# Patient Record
Sex: Female | Born: 1945 | Race: White | Hispanic: No | Marital: Married | Smoking: Never smoker
Health system: Southern US, Academic
[De-identification: ages and names within clinical notes are randomized; demographics above are authoritative.]

## PROBLEM LIST (undated history)

## (undated) DIAGNOSIS — H04123 Dry eye syndrome of bilateral lacrimal glands: Secondary | ICD-10-CM

## (undated) DIAGNOSIS — J8410 Pulmonary fibrosis, unspecified: Secondary | ICD-10-CM

## (undated) DIAGNOSIS — G4700 Insomnia, unspecified: Secondary | ICD-10-CM

## (undated) DIAGNOSIS — M3500 Sicca syndrome, unspecified: Secondary | ICD-10-CM

## (undated) DIAGNOSIS — H353290 Exudative age-related macular degeneration, unspecified eye, stage unspecified: Secondary | ICD-10-CM

## (undated) DIAGNOSIS — C801 Malignant (primary) neoplasm, unspecified: Secondary | ICD-10-CM

## (undated) DIAGNOSIS — H25019 Cortical age-related cataract, unspecified eye: Secondary | ICD-10-CM

## (undated) DIAGNOSIS — L4050 Arthropathic psoriasis, unspecified: Secondary | ICD-10-CM

## (undated) DIAGNOSIS — G2581 Restless legs syndrome: Secondary | ICD-10-CM

## (undated) DIAGNOSIS — H35033 Hypertensive retinopathy, bilateral: Secondary | ICD-10-CM

## (undated) DIAGNOSIS — Z860100 Personal history of colon polyps, unspecified: Secondary | ICD-10-CM

## (undated) DIAGNOSIS — K219 Gastro-esophageal reflux disease without esophagitis: Secondary | ICD-10-CM

## (undated) DIAGNOSIS — H3530 Unspecified macular degeneration: Secondary | ICD-10-CM

## (undated) HISTORY — PX: BUNIONECTOMY: SHX129

## (undated) HISTORY — PX: EYE SURGERY: SHX253

## (undated) HISTORY — DX: Insomnia, unspecified: G47.00

## (undated) HISTORY — DX: Sjogren syndrome, unspecified: M35.00

## (undated) HISTORY — PX: TUBAL LIGATION: SHX77

## (undated) HISTORY — DX: Arthropathic psoriasis, unspecified: L40.50

## (undated) HISTORY — DX: Restless legs syndrome: G25.81

## (undated) HISTORY — DX: Exudative age-related macular degeneration, unspecified eye, stage unspecified: H35.3290

## (undated) HISTORY — DX: Pulmonary fibrosis, unspecified: J84.10

## (undated) HISTORY — PX: COLONOSCOPY: SHX174

---

## 2010-06-20 ENCOUNTER — Ambulatory Visit
Admission: RE | Admit: 2010-06-20 | Disposition: A | Discharge status: Home / Self Care | Payer: Self-pay | Source: Ambulatory Visit

## 2010-07-17 ENCOUNTER — Ambulatory Visit
Admission: RE | Admit: 2010-07-17 | Disposition: A | Discharge status: Home / Self Care | Payer: Self-pay | Source: Ambulatory Visit

## 2011-02-12 ENCOUNTER — Ambulatory Visit
Admission: RE | Admit: 2011-02-12 | Disposition: A | Discharge status: Home / Self Care | Payer: Self-pay | Source: Ambulatory Visit

## 2011-08-06 ENCOUNTER — Ambulatory Visit
Admission: RE | Admit: 2011-08-06 | Disposition: A | Discharge status: Home / Self Care | Payer: Self-pay | Source: Ambulatory Visit | Admitting: Gastroenterology

## 2012-01-08 ENCOUNTER — Ambulatory Visit
Admission: RE | Admit: 2012-01-08 | Disposition: A | Discharge status: Home / Self Care | Payer: Self-pay | Source: Ambulatory Visit

## 2012-07-09 LAB — VH H. PYLORI ANTIBODY IGG HLAB: H. pylori Ab IgG: NEGATIVE

## 2013-02-23 ENCOUNTER — Other Ambulatory Visit: Payer: Self-pay | Admitting: Physician Assistant

## 2013-02-23 DIAGNOSIS — Z1231 Encounter for screening mammogram for malignant neoplasm of breast: Secondary | ICD-10-CM

## 2013-02-25 ENCOUNTER — Ambulatory Visit: Payer: Medicare Other

## 2013-02-26 ENCOUNTER — Ambulatory Visit
Admission: RE | Admit: 2013-02-26 | Discharge: 2013-02-26 | Disposition: A | Discharge status: Home / Self Care | Payer: Medicare Other | Source: Ambulatory Visit | Attending: Physician Assistant | Admitting: Physician Assistant

## 2013-02-26 DIAGNOSIS — R920 Mammographic microcalcification found on diagnostic imaging of breast: Secondary | ICD-10-CM | POA: Insufficient documentation

## 2013-02-26 DIAGNOSIS — Z1231 Encounter for screening mammogram for malignant neoplasm of breast: Secondary | ICD-10-CM | POA: Insufficient documentation

## 2013-02-26 DIAGNOSIS — N6489 Other specified disorders of breast: Secondary | ICD-10-CM | POA: Insufficient documentation

## 2013-04-13 ENCOUNTER — Other Ambulatory Visit: Payer: Self-pay | Admitting: Internal Medicine

## 2013-04-13 ENCOUNTER — Ambulatory Visit
Admission: RE | Admit: 2013-04-13 | Discharge: 2013-04-13 | Disposition: A | Discharge status: Home / Self Care | Payer: Medicare Other | Source: Ambulatory Visit | Attending: Internal Medicine | Admitting: Internal Medicine

## 2013-04-13 DIAGNOSIS — M069 Rheumatoid arthritis, unspecified: Secondary | ICD-10-CM | POA: Insufficient documentation

## 2013-04-13 DIAGNOSIS — R0609 Other forms of dyspnea: Secondary | ICD-10-CM | POA: Insufficient documentation

## 2013-04-13 DIAGNOSIS — R0989 Other specified symptoms and signs involving the circulatory and respiratory systems: Secondary | ICD-10-CM

## 2013-04-13 DIAGNOSIS — J189 Pneumonia, unspecified organism: Secondary | ICD-10-CM | POA: Insufficient documentation

## 2013-04-20 ENCOUNTER — Other Ambulatory Visit: Payer: Self-pay | Admitting: Internal Medicine

## 2013-04-20 DIAGNOSIS — R918 Other nonspecific abnormal finding of lung field: Secondary | ICD-10-CM

## 2013-04-23 ENCOUNTER — Ambulatory Visit
Admission: RE | Admit: 2013-04-23 | Discharge: 2013-04-23 | Disposition: A | Discharge status: Home / Self Care | Payer: Medicare Other | Source: Ambulatory Visit | Attending: Internal Medicine | Admitting: Internal Medicine

## 2013-04-23 DIAGNOSIS — J8410 Pulmonary fibrosis, unspecified: Secondary | ICD-10-CM | POA: Insufficient documentation

## 2013-04-23 DIAGNOSIS — K7689 Other specified diseases of liver: Secondary | ICD-10-CM | POA: Insufficient documentation

## 2013-04-23 DIAGNOSIS — R918 Other nonspecific abnormal finding of lung field: Secondary | ICD-10-CM

## 2013-07-09 ENCOUNTER — Ambulatory Visit (INDEPENDENT_AMBULATORY_CARE_PROVIDER_SITE_OTHER): Payer: Medicare Other | Admitting: Pulmonary Disease

## 2013-07-09 ENCOUNTER — Encounter (INDEPENDENT_AMBULATORY_CARE_PROVIDER_SITE_OTHER): Payer: Self-pay | Admitting: Pulmonary Disease

## 2013-07-09 ENCOUNTER — Ambulatory Visit (INDEPENDENT_AMBULATORY_CARE_PROVIDER_SITE_OTHER): Payer: Self-pay | Admitting: Pulmonary Disease

## 2013-07-09 ENCOUNTER — Other Ambulatory Visit: Payer: Self-pay | Admitting: Pulmonary Disease

## 2013-07-09 VITALS — BP 145/77 | HR 66 | Temp 99.1°F | Ht 64.0 in | Wt 153.0 lb

## 2013-07-09 DIAGNOSIS — R0602 Shortness of breath: Secondary | ICD-10-CM

## 2013-07-09 DIAGNOSIS — J8410 Pulmonary fibrosis, unspecified: Secondary | ICD-10-CM

## 2013-07-09 DIAGNOSIS — M3500 Sicca syndrome, unspecified: Secondary | ICD-10-CM

## 2013-07-09 DIAGNOSIS — L4050 Arthropathic psoriasis, unspecified: Secondary | ICD-10-CM | POA: Insufficient documentation

## 2013-07-09 DIAGNOSIS — E7800 Pure hypercholesterolemia, unspecified: Secondary | ICD-10-CM | POA: Insufficient documentation

## 2013-07-09 DIAGNOSIS — J849 Interstitial pulmonary disease, unspecified: Secondary | ICD-10-CM

## 2013-07-09 NOTE — Progress Notes (Signed)
Subjective:     Patient ID:  Katelyn Gates is an 68 y.o. female     Chief Complaint:    Chief Complaint   Patient presents with    New Patient    Follow-up After Testing     ct     Sleep Apnea       HPI  Mrs. Katelyn Gates is a 68 year old female with history of Seronegative Rheumatoid arthiritis, Sjogren's syndrome here for her first visit for evaluation of shortness of breath. Initially when she was diagnosed with RA she was started on Methotrexate with significant improvement in her symptoms. She recently had a CT chest for dyspnea, which suggested upper lobe ILF, mostly peripherally. He dyspnea started about three months ago and is mostly exertional.  She has no cough, wheezing, chest pain, palpitation, orthopnea, PND or LE swelling. After the CT chest result methotrexate was stopped and she was started on Imuran. She is a never smoker and denies any occupation or environmental exposure history.   Past Medical History  Current Outpatient Prescriptions   Medication Sig    azaTHIOprine (IMURAN) 50 mg Oral Tablet Take 50 mg by mouth Once a day    butalbital-aspirin-caffeine (FIORINAL) 50-325-40 mg Oral Capsule Take 1 Cap by mouth Every 4 hours as needed for Pain    Fish Oil-Omega-3 Fatty Acids 300-1,000 mg Oral Capsule Take by mouth    Pilocarpine HCl (SALAGEN) 5 mg Oral Tablet Take by mouth    simvastatin (ZOCOR) 10 mg Oral Tablet Take 10 mg by mouth Every evening    VIT A/VIT C/VIT E/ZINC/COPPER (PRESERVISION AREDS ORAL) Take by mouth     Allergies   Allergen Reactions    Penicillins Hives/ Urticaria     No past medical history on file.  No past surgical history on file.  Family History   Problem Relation Age of Onset    Congestive Heart Failure Mother     Stroke Mother     Hypertension Mother     Diabetes Mother     Arthritis-osteo Sister     Diabetes Brother      History     Social History    Marital Status: Married     Spouse Name: N/A     Number of Children: N/A    Years of Education: N/A          Occupational History    retired Statisticianpre school teacher       Social History Main Topics    Smoking status: Never Smoker     Smokeless tobacco: Not on file    Alcohol Use: No    Drug Use: No    Sexual Activity: Not on file     Other Topics Concern    Not on file     Social History Narrative    No narrative on file       .  ROS  Constitutional: positive for fatigue  Respiratory: positive for dyspnea on exertion  Cardiovascular: negative  Gastrointestinal: negative  Hematologic/lymphatic: negative  Musculoskeletal:positive for myalgias and arthralgias  Neurological: negative  Behavioral/Psych: negative  All other ROS Negative    Objective:     Physical Exam    Pulmonary Vitals: BP 145/77   Pulse 66   Temp(Src) 37.3 C (99.1 F) (Oral)   Ht 1.626 m (5\' 4" )   Wt 69.4 kg (153 lb)   BMI 26.25 kg/m2   SpO2 98%  General: appears chronically ill  HENT:ENT without erythema or injection,  mucous membranes moist.  Neck: No JVD or thyromegaly or lymphadenopathy  Respiratory Observatoins Unlabored breathing  Tracheal Exam No tracheal deviation  Chest Wall Palp5ation No localized tenderness  Lungs: decreased breath sounds throughout all lung fields and rales superior posterior bilaterally  Cardiovascular:    Heart S1, S2 normal  Abdomen: soft, non-tender and bowel sounds normal  Extremities: no cyanosis or edema  Neurologic: CN II - XII grossly intact   .    Assessment & Plan:       ICD-9-CM    1. Psoriatic arthritis 696.0    2. Sjogren's disease 710.2    3. Hypercholesterolemia 272.0    4. Shortness of breath 786.05 FULL PULMONARY FUNCTION STUDY (INCLUDES FVC,SVC,DLCO,FRC,PRE/POST BD)     TRANSTHORACIC ECHOCARDIOGRAM - ADULT   5. ILD (interstitial lung disease) 515 CT CHEST HIGH RESOLUTION       68 year old female with RA and Sjogren's syndrome present with subacute shortness of breath and bilateral apical pulmonary fibrosis.  Radiographically, this does not seem to be due to methotrexate, but cannot rule it out. Since  methotrexate has ben stopped, I will do a High resolution CT and if no significant change in the pattern of the fibrosis,she will need a biopsy.   Plan:   PFT   CBC   HRCT   Echocardiogram (PAH screening)  RTO in one months

## 2013-07-16 ENCOUNTER — Other Ambulatory Visit: Payer: Self-pay | Admitting: Pulmonary Disease

## 2013-07-16 ENCOUNTER — Ambulatory Visit
Admission: RE | Admit: 2013-07-16 | Discharge: 2013-07-16 | Disposition: A | Discharge status: Home / Self Care | Payer: Medicare Other | Source: Ambulatory Visit | Attending: Pulmonary Disease | Admitting: Pulmonary Disease

## 2013-07-16 ENCOUNTER — Ambulatory Visit
Admission: RE | Admit: 2013-07-16 | Discharge: 2013-07-16 | Disposition: A | Discharge status: Home / Self Care | Payer: Medicare Other | Source: Ambulatory Visit

## 2013-07-16 DIAGNOSIS — J8410 Pulmonary fibrosis, unspecified: Secondary | ICD-10-CM | POA: Insufficient documentation

## 2013-07-16 DIAGNOSIS — I059 Rheumatic mitral valve disease, unspecified: Secondary | ICD-10-CM | POA: Insufficient documentation

## 2013-07-16 DIAGNOSIS — K7689 Other specified diseases of liver: Secondary | ICD-10-CM | POA: Insufficient documentation

## 2013-07-16 DIAGNOSIS — I079 Rheumatic tricuspid valve disease, unspecified: Secondary | ICD-10-CM | POA: Insufficient documentation

## 2013-07-16 DIAGNOSIS — R0602 Shortness of breath: Secondary | ICD-10-CM

## 2013-07-16 LAB — PULMONARY FUNCTION TEST
DL/VA- %Pred: 100 ml/min/mmHg/L
DL/VA- Actual: 4.8 ml/min/mmHg/L
DL/VA- Pred: 4.8 ml/min/mmHg/L
DLCOunc- %Pred: 51 ml/min/mmHg
DLCOunc- Actual: 12.49 ml/min/mmHg
DLCOunc- Pred: 24.26 ml/min/mmHg
ERV (Post-Bronch) %Chng: 4 L
ERV (Post-Bronch) %Pred: 67 L
ERV (Post-Bronch) Actual: 0.61 L
ERV (Pre-Bronch) %Pred: 65 L
ERV (Pre-Bronch) Actual: 0.59 L
ERV (Pre-Bronch) Pred: 0.91 L
FEF 25% (Pre-Bronch) Pred: 4.8 L/sec
FEF 25-75% (Post-Bronch) %Chng: 21 L/sec
FEF 25-75% (Post-Bronch) %Pred: 105 L/sec
FEF 25-75% (Post-Bronch) Actual: 2.11 L/sec
FEF 25-75% (Pre-Bronch) %Pred: 87 L/sec
FEF 25-75% (Pre-Bronch) Actual: 1.75 L/sec
FEF 25-75% (Pre-Bronch) Pred: 2.01 L/sec
FEF 75% (Pre-Bronch) Pred: 1.04 L/sec
FEF Max (Post-Bronch) %Chng: 17 L/sec
FEF Max (Post-Bronch) %Pred: 101 L/sec
FEF Max (Post-Bronch) Actual: 5.89 L/sec
FEF Max (Pre-Bronch) %Pred: 87 L/sec
FEF Max (Pre-Bronch) Actual: 5.05 L/sec
FEF Max (Pre-Bronch) Pred: 5.83 L/sec
FEF25 (% Predicted-Pre): 105 L/sec
FEF25 (%Change-Post): 14 L/sec
FEF25 (%Predicted-Post): 120 L/sec
FEF25 (Post): 5.76 L/sec
FEF25 (Pre-Actual): 5.03 L/sec
FEF75 (% Predicted-Pre): 56 L/sec
FEF75 (%Change-Post): 37 L/sec
FEF75 (%Predicted-Post): 77 L/sec
FEF75 (Post): 0.8 L/sec
FEF75 (Pre-Actual): 0.58 L/sec
FEV1 (Post-Bronch) %Chng: 5 L
FEV1 (Post-Bronch) %Pred: 84 L
FEV1 (Post-Bronch) Post: 1.96 L
FEV1 (Pre-Bronch) %Pred: 80 L
FEV1 (Pre-Bronch) Actual: 1.86 L
FEV1 (Pre-Bronch) Pred: 2.34 L
FEV1/FVC (Post-Bronch) %Chng: 4 %
FEV1/FVC (Post-Bronch) %Pred: 108 %
FEV1/FVC (Post-Bronch) Actual: 82 %
FEV1/FVC (Pre-Bronch) %Pred: 104 %
FEV1/FVC (Pre-Bronch) Actual: 79 %
FEV1/FVC (Pre-Bronch) Pred: 76 %
FEV1FEV6 (% Predicted-Pre): 100 %
FEV1FEV6 (%Change-Post): 3 %
FEV1FEV6 (%Predicted-Post): 103 %
FEV1FEV6 (Post): 82 %
FEV1FEV6 (Pre-Actual): 80 %
FEV1FEV6 (Predicted): 80 %
FEV6 (% Predicted-Pre): 79 L
FEV6 (%Change-Post): 2 L
FEV6 (%Predicted-Post): 81 L
FEV6 (Post): 2.38 L
FEV6 (Pre-Actual): 2.34 L
FEV6 (Predicted): 2.95 L
FEV6FVC (% Predicted-Pre): 104 %
FEV6FVC (%Change-Post): 0 %
FEV6FVC (Post): 100 %
FEV6FVC (Pre-Actual): 99 %
FEV6FVC (Predicted): 96 %
FIF50 (Predicted): 3.28 L/sec
FIFMax (%Change-Post): -13 L/sec
FIFMax (Post): 3.38 L/sec
FIFMax (Pre-Actual): 3.9 L/sec
FIVC (%Change-Post): 0 L
FIVC (Post): 2.05 L
FIVC (Pre-Actual): 2.05 L
FVC (Post-Bronch) %Chng: 1 L
FVC (Post-Bronch) %Pred: 77 L
FVC (Post-Bronch) Actual: 2.38 L
FVC (Pre-Bronch) %Pred: 76 L
FVC (Pre-Bronch) Actual: 2.35 L
FVC (Pre-Bronch) Pred: 3.08 L
Gaw (Post-Bronch) %Chng: 26 L/s/cmH2O
Gaw (Post-Bronch) %Pred: 87 L/s/cmH2O
Gaw (Post-Bronch) Actual: 0.9 L/s/cmH2O
Gaw (Pre-Bronch) %Pred: 69 L/s/cmH2O
Gaw (Pre-Bronch) Actual: 0.71 L/s/cmH2O
Gaw (Pre-Bronch) Pred: 1.03 L/s/cmH2O
IC (Post-Bronch) %Chng: -24 L
IC (Post-Bronch) %Pred: 58 L
IC (Post-Bronch) Actual: 1.25 L
IC (Pre-Bronch) %Pred: 76 L
IC (Pre-Bronch) Actual: 1.64 L
IC (Pre-Bronch) Pred: 2.17 L
IVC (Pre-Actual): 2.14 L
RV (Pleth) (Post-Bronch) %Pred: 118 L
RV (Pleth) (Post-Bronch) Actual: 2.53 L
RV (Pleth) (Pre-Bronch) %Pred: 119 L
RV (Pleth) (Pre-Bronch) Actual: 2.54 L
RV (Pleth) (Pre-Bronch) Pred: 2.14 L
RV/TLC (Pleth) (Post-Bronch) %Pred: 137 %
RV/TLC (Pleth) (Post-Bronch) Actual: 58 %
RV/TLC (Pleth) (Pre-Bronch) Actual: 53 %
RV/TLC (Pleth) (Pre-Bronch) Pred: 42 %
Raw (Post-Bronch) %Chng: -20 cmH2O/L/s
Raw (Post-Bronch) %Pred: 60 cmH2O/L/s
Raw (Post-Bronch) Actual: 1.12 cmH2O/L/s
Raw (Pre-Bronch) %Pred: 75 cmH2O/L/s
Raw (Pre-Bronch) Actual: 1.4 cmH2O/L/s
Raw (Pre-Bronch) Pred: 1.86 cmH2O/L/s
SVC (Post-Bronch) %Chng: -17 L
SVC (Post-Bronch) %Pred: 60 L
SVC (Post-Bronch) Actual: 1.86 L
SVC (Pre-Bronch) %Pred: 72 L
SVC (Pre-Bronch) Actual: 2.23 L
SVC (Pre-Bronch) Pred: 3.08 L
TGV (Post-Bronch) %Pred: 109 L
TGV (Post-Bronch) Actual: 3.14 L
TGV (Pre-Bronch) %Pred: 109 L
TGV (Pre-Bronch) Actual: 3.13 L
TGV (Pre-Bronch) Pred: 2.88 L
TLC (Pleth) (Post-Bronch) %Chng: -8 L
TLC (Pleth) (Post-Bronch) %Pred: 87 L
TLC (Pleth) (Post-Bronch) Actual: 4.39 L
TLC (Pleth) (Pre-Bronch) %Pred: 94 L
TLC (Pleth) (Pre-Bronch) Actual: 4.77 L
TLC (Pleth) (Pre-Bronch) Pred: 5.05 L
VA (% Predicted-Pre): 64 L
VA (Pre-Actual): 3.26 L
VA- Pred: 5.05 L
sGaw (Post-Bronch) %Pred: 139 1/cmH2O*s
sGaw (Post-Bronch) Actual: 0.28 1/cmH2O*s
sGaw (Pre-Bronch) %Pred: 109 1/cmH2O*s
sGaw (Pre-Bronch) Actual: 0.22 1/cmH2O*s
sGaw (Pre-Bronch) Pred: 0.2 1/cmH2O*s
sRaw (Post-Bronch) %Pred: 76 cmH2O*s
sRaw (Post-Bronch) Actual: 3.62 cmH2O*s
sRaw (Pre-Bronch) %Pred: 97 cmH2O*s
sRaw (Pre-Bronch) Actual: 4.6 cmH2O*s
sRaw (Pre-Bronch) Pred: 4.76 cmH2O*s

## 2013-07-16 MED ORDER — ALBUTEROL SULFATE (2.5 MG/3ML) 0.083% IN NEBU
2.5000 mg | INHALATION_SOLUTION | Freq: Once | RESPIRATORY_TRACT | Status: AC
Start: 2013-07-16 — End: 2013-07-16
  Administered 2013-07-16: 2.5 mg via RESPIRATORY_TRACT

## 2013-07-27 NOTE — Procedures (Signed)
Date: 07/16/2013  REQUESTING PHYSICIAN: Adron Bene, MD    HEIGHT:  64.00    WEIGHT:  150.00    TECHNICIAN: MT    RACE: Caucasian.    SMOKING HX: Never smoked.    DIAGNOSIS: Shortness of breath, chronic obstructive pulmonary  disease.    Forced vital capacity of 2.35 L, 76% of predicted.  The FEV1 is  1.86 L, 79% of predicted.  The FEV1% is 79.  The FEF 25-75 is  1.75 L/second which is 87% of predicted.    After the application of inhaled bronchodilators, there is  statistically significant improvement in the FEF 25-75 alone.    Lung volumes reveal total lung capacity 94% of predicted.  Residual volume 118% of predicted.  Diffusion capacity 51% of  predicted.  Airway resistance is normal.  Flow volume loops  demonstrate a decreased volume.    ASSESSMENT: The patient's spirometry is essentially within  normal limits.  There is improvement in the FEF 25-75 with  bronchodilators.  The lung volumes do demonstrate mild  hyperinflation.  The patient's diffusion capacity is reduced.  The reduction appears to be out of proportion to the above noted  spirometric changes.  No previous pulmonary function studies are  available for comparison at this time.        [id number]  DD: 07/27/2013 07:37:50  DT: 07/27/2013 09:51:04  JOB: [id number]

## 2013-08-13 ENCOUNTER — Ambulatory Visit (INDEPENDENT_AMBULATORY_CARE_PROVIDER_SITE_OTHER): Payer: Medicare Other | Admitting: Pulmonary Disease

## 2013-08-13 ENCOUNTER — Encounter (INDEPENDENT_AMBULATORY_CARE_PROVIDER_SITE_OTHER): Payer: Self-pay | Admitting: Pulmonary Disease

## 2013-08-13 VITALS — BP 138/78 | HR 65 | Temp 97.9°F | Ht 64.0 in | Wt 149.0 lb

## 2013-08-13 DIAGNOSIS — M3500 Sicca syndrome, unspecified: Secondary | ICD-10-CM

## 2013-08-13 DIAGNOSIS — J8410 Pulmonary fibrosis, unspecified: Secondary | ICD-10-CM

## 2013-08-13 DIAGNOSIS — R0602 Shortness of breath: Secondary | ICD-10-CM

## 2013-08-13 DIAGNOSIS — E7800 Pure hypercholesterolemia, unspecified: Secondary | ICD-10-CM

## 2013-08-13 DIAGNOSIS — J849 Interstitial pulmonary disease, unspecified: Secondary | ICD-10-CM

## 2013-08-13 DIAGNOSIS — L4050 Arthropathic psoriasis, unspecified: Secondary | ICD-10-CM

## 2013-08-13 NOTE — Progress Notes (Signed)
Subjective:     Patient ID:  Katelyn Gates is an 68 y.o. female     Chief Complaint:    Chief Complaint   Patient presents with    Follow-up After Testing     pft     Sleep Apnea    Arthritis     psoriatic       HPI  [name] is here for follow up visit. She has some dyspnea on exertion, which has not change for the past two years.  Her Ct scan showed mild fibrotic changes in mid to upper lung zones, in the periphery. Her PFT was normal, but i do not have her full PFT. Her echo was normal as well. She has stopped her methotrexate     Past Medical History  Current Outpatient Prescriptions   Medication Sig    apremilast (OTEZLA) 30 mg Oral Tablet Take 30 mg by mouth Twice daily    butalbital-aspirin-caffeine (FIORINAL) 50-325-40 mg Oral Capsule Take 1 Cap by mouth Every 4 hours as needed for Pain    Fish Oil-Omega-3 Fatty Acids 300-1,000 mg Oral Capsule Take by mouth    Pilocarpine HCl (SALAGEN) 5 mg Oral Tablet Take by mouth    VIT A/VIT C/VIT E/ZINC/COPPER (PRESERVISION AREDS ORAL) Take by mouth     Allergies   Allergen Reactions    Penicillins Hives/ Urticaria     No past medical history on file.  No past surgical history on file.  Family History   Problem Relation Age of Onset    Congestive Heart Failure Mother     Stroke Mother     Hypertension Mother     Diabetes Mother     Arthritis-osteo Sister     Diabetes Brother      History     Social History    Marital Status: Married     Spouse Name: N/A     Number of Children: N/A    Years of Education: N/A     Occupational History    retired Statisticianpre school teacher       Social History Main Topics    Smoking status: Never Smoker     Smokeless tobacco: Not on file    Alcohol Use: No    Drug Use: No    Sexual Activity: Not on file     Other Topics Concern    Not on file     Social History Narrative    No narrative on file       ROS  Constitutional: positive for fatigue  Respiratory: negative  Cardiovascular: negative  Gastrointestinal:  negative  Musculoskeletal:positive for arthralgias  Neurological: negative  Behavioral/Psych: negative  All other ROS Negative  Objective:     Physical Exam    Pulmonary Vitals: BP 138/78   Pulse 65   Temp(Src) 36.6 C (97.9 F) (Tympanic)   Ht 1.626 m (5\' 4" )   Wt 67.586 kg (149 lb)   BMI 25.56 kg/m2   SpO2 97%  Neck: No JVD or thyromegaly or lymphadenopathy  Respiratory Observatoins Unlabored breathing  Tracheal Exam No tracheal deviation  Chest Wall Palpation No localized tenderness  Lungs: clear to auscultation and percussion bilaterally.   Cardiovascular:    Heart regular rate and rhythm, S1, S2 normal, no murmur, click, rub or gallop  Abdomen: soft, non-tender and bowel sounds normal  Extremities: extremities normal, atraumatic, no cyanosis or edema  Neurologic: CN II - XII grossly intact   .  CT chest:  Fibrotic changes  in mid to upper lung zones, mostly in the periphery  PFT: moderate obstruction and restriction  With decrease in diffusion capacity   Echo normal   Assessment & Plan:       ICD-9-CM    1. Sjogren's disease 710.2    2. Hypercholesterolemia 272.0    3. Shortness of breath 786.05    4. Psoriatic arthritis 696.0    5. ILD (interstitial lung disease) 515 CT CHEST HIGH RESOLUTION         68 year old with DOE and mild mid to upper lung zone fibrosis. The etiology includes methotrexate and COP. I dicussed in detail different option regarding the diagnosis. The options are: open lung biopsy vs radiographic follow up vs six month steroids. At this time she would like to just monitor radiographically.  I would avoid any drug that may have pulmonary toxicity as side effect, such as methotrexate.   RTO in six months

## 2013-08-20 ENCOUNTER — Encounter (INDEPENDENT_AMBULATORY_CARE_PROVIDER_SITE_OTHER): Payer: Medicare Other | Admitting: Pulmonary Disease

## 2013-08-31 ENCOUNTER — Encounter (INDEPENDENT_AMBULATORY_CARE_PROVIDER_SITE_OTHER): Payer: Medicare Other | Admitting: Pulmonary Disease

## 2013-10-08 ENCOUNTER — Ambulatory Visit
Admission: RE | Admit: 2013-10-08 | Discharge: 2013-10-08 | Disposition: A | Discharge status: Home / Self Care | Payer: Medicare Other | Source: Ambulatory Visit | Attending: Interventional Cardiology | Admitting: Interventional Cardiology

## 2013-10-08 ENCOUNTER — Ambulatory Visit: Payer: Medicare Other | Admitting: Interventional Cardiology

## 2013-10-08 ENCOUNTER — Encounter
Admission: RE | Disposition: A | Discharge status: Home / Self Care | Payer: Self-pay | Source: Ambulatory Visit | Attending: Interventional Cardiology

## 2013-10-08 DIAGNOSIS — R942 Abnormal results of pulmonary function studies: Secondary | ICD-10-CM | POA: Insufficient documentation

## 2013-10-08 DIAGNOSIS — J8410 Pulmonary fibrosis, unspecified: Secondary | ICD-10-CM | POA: Insufficient documentation

## 2013-10-08 DIAGNOSIS — Z880 Allergy status to penicillin: Secondary | ICD-10-CM | POA: Insufficient documentation

## 2013-10-08 DIAGNOSIS — I2789 Other specified pulmonary heart diseases: Secondary | ICD-10-CM

## 2013-10-08 DIAGNOSIS — G4733 Obstructive sleep apnea (adult) (pediatric): Secondary | ICD-10-CM | POA: Insufficient documentation

## 2013-10-08 DIAGNOSIS — E785 Hyperlipidemia, unspecified: Secondary | ICD-10-CM | POA: Insufficient documentation

## 2013-10-08 DIAGNOSIS — R0989 Other specified symptoms and signs involving the circulatory and respiratory systems: Secondary | ICD-10-CM | POA: Insufficient documentation

## 2013-10-08 DIAGNOSIS — R002 Palpitations: Secondary | ICD-10-CM | POA: Insufficient documentation

## 2013-10-08 DIAGNOSIS — L4050 Arthropathic psoriasis, unspecified: Secondary | ICD-10-CM | POA: Insufficient documentation

## 2013-10-08 DIAGNOSIS — R0609 Other forms of dyspnea: Secondary | ICD-10-CM | POA: Insufficient documentation

## 2013-10-08 DIAGNOSIS — I272 Pulmonary hypertension, unspecified: Secondary | ICD-10-CM

## 2013-10-08 DIAGNOSIS — R0600 Dyspnea, unspecified: Secondary | ICD-10-CM | POA: Diagnosis present

## 2013-10-08 HISTORY — PX: LEFT HEART CATH: SHX5946

## 2013-10-08 HISTORY — DX: Pulmonary fibrosis, unspecified: J84.10

## 2013-10-08 HISTORY — DX: Arthropathic psoriasis, unspecified: L40.50

## 2013-10-08 HISTORY — DX: Sjogren syndrome, unspecified: M35.00

## 2013-10-08 HISTORY — DX: Gastro-esophageal reflux disease without esophagitis: K21.9

## 2013-10-08 HISTORY — DX: Unspecified macular degeneration: H35.30

## 2013-10-08 LAB — ECG 12-LEAD
P Wave Axis: 65 deg
P Wave Duration: 122 ms
P-R Interval: 186 ms
Patient Age: 67 years
Q-T Dispersion: 76 ms
Q-T Interval(Corrected): 406 ms
Q-T Interval: 394 ms
QRS Axis: 15 deg
QRS Duration: 72 ms
T Axis: 60 deg
Ventricular Rate: 64 /min

## 2013-10-08 LAB — CBC AND DIFFERENTIAL
Basophils %: 1.4 % (ref 0.0–3.0)
Basophils Absolute: 0.1 10*3/uL (ref 0.0–0.3)
Eosinophils %: 3.6 % (ref 0.0–7.0)
Eosinophils Absolute: 0.2 10*3/uL (ref 0.0–0.8)
Hematocrit: 40.9 % (ref 36.0–48.0)
Hemoglobin: 14.6 gm/dL (ref 12.0–16.0)
Lymphocytes Absolute: 1.8 10*3/uL (ref 0.6–5.1)
Lymphocytes: 37.6 % (ref 15.0–46.0)
MCH: 34 pg (ref 28–35)
MCHC: 36 gm/dL (ref 32–36)
MCV: 94 fL (ref 80–100)
MPV: 6.6 fL (ref 6.0–10.0)
Monocytes Absolute: 0.5 10*3/uL (ref 0.1–1.7)
Monocytes: 10.1 % (ref 3.0–15.0)
Neutrophils %: 47.3 % (ref 42.0–78.0)
Neutrophils Absolute: 2.2 10*3/uL (ref 1.7–8.6)
PLT CT: 262 10*3/uL (ref 130–440)
RBC: 4.35 10*6/uL (ref 3.80–5.00)
RDW: 11.2 % (ref 11.0–14.0)
WBC: 4.7 10*3/uL (ref 4.0–11.0)

## 2013-10-08 LAB — I-STAT CHEM 8 CARTRIDGE
Anion Gap I-Stat: 17 — ABNORMAL HIGH (ref 7–16)
BUN I-Stat: 10 mg/dL (ref 6–20)
Calcium Ionized I-Stat: 5.4 mg/dL — ABNORMAL HIGH (ref 4.35–5.10)
Chloride I-Stat: 103 mMol/L (ref 98–112)
Creatinine I-Stat: 0.7 mg/dL (ref 0.60–1.10)
EGFR: >60 mL/min/{1.73_m2}
Glucose I-Stat: 98 mg/dL (ref 70–99)
Hematocrit I-Stat: 41 % (ref 36.0–48.0)
Hemoglobin I-Stat: 13.9 gm/dL (ref 12.0–16.0)
Potassium I-Stat: 4 mMol/L (ref 3.5–5.3)
Sodium I-Stat: 143 mMol/L (ref 135–145)
TCO2 I-Stat: 27 mMol/L (ref 24–29)

## 2013-10-08 LAB — LIPID PANEL
Cholesterol: 263 mg/dL — ABNORMAL HIGH (ref 75–199)
Coronary Heart Disease Risk: 3.81
HDL: 69 mg/dL — ABNORMAL HIGH (ref 45–65)
LDL Calculated: 173 mg/dL
Triglycerides: 105 mg/dL (ref 10–150)
VLDL: 21 (ref 0–40)

## 2013-10-08 LAB — VH I-STAT CHEM 8 NOTIFICATION

## 2013-10-08 SURGERY — RIGHT HEART CATH
Laterality: Right

## 2013-10-08 MED ORDER — MIDAZOLAM HCL 2 MG/2ML IJ SOLN
INTRAMUSCULAR | Status: AC
Start: 2013-10-08 — End: 2013-10-08
  Administered 2013-10-08: 1 mg via INTRAVENOUS
  Filled 2013-10-08: qty 2

## 2013-10-08 MED ORDER — LIDOCAINE HCL 1 % IJ SOLN
INTRAMUSCULAR | Status: AC
Start: 2013-10-08 — End: 2013-10-08
  Administered 2013-10-08: 15 mL via SUBCUTANEOUS
  Filled 2013-10-08: qty 20

## 2013-10-08 MED ORDER — HEPARIN (PORCINE) IN NACL 2-0.9 UNIT/ML-% IJ SOLN
INTRAMUSCULAR | Status: AC
Start: 2013-10-08 — End: 2013-10-08
  Filled 2013-10-08: qty 500

## 2013-10-08 NOTE — Progress Notes (Signed)
Discharge instructions reviewed with pt, pt verbalized understanding, IV discontinued, cath intact, tele off, pt up to get dressed, pt discharge ambulatory to home with family.

## 2013-10-08 NOTE — H&P (Signed)
CARD: P.Romeo Apple, MD  PCP:   Erline HauKathie Rhodes. D'Allessio MD    HPI:  Patient referred for RHC by pulmonology.  Patient states she has SOB with exertion.  Some days she can walk a few miles and other days she has to slow down.  She denies any CP/pressure.  No edema, PND, orthopnea. She does have palpitations that come and go and can last for a week. She denies presyncope/syncope.  She does have some LH with certain medications but improved when off meds.      ECHO 07/2013  Normal LV size and function. EF 55-60%  Normal RV size and function. Structurally normal valves with trace MR, TR. Insufficient to estimate pulmonary pressure.     PMHx  1. Psoriatic arthritis  2. Schrogren's   3. Pulmonary fibrosis  4. OSA pending follow up sleep study  5. New HLP noted today on FLP    ROS:   Patient denies history of: CAD, CVD, PAD, TIA/CVA, blood clot, bleeding disorder, blood urine or stool, internal bleeding, kidney, liver, GERD, thyroid, cancer, seizure, MRSA, pacer or ICD, CHF    Social  Married  No tobacco  Rare ETOH  No illegal drug use    ALLERGY: PCN    MEDS:      Results for Courtney Callahan, Courtney Callahan (MRN [id number]) as of 10/08/2013 08:29   Ref. Range 10/08/2013 07:39   WBC Latest Range: 4.0-11.0 K/cmm 4.7   Hemoglobin Latest Range: 12.0-16.0 gm/dL 54.0   Hematocrit Latest Range: 36.0-48.0 % 40.9   Platelet Count Latest Range: 130-440 K/cmm 262   RBC Latest Range: 3.80-5.00 M/cmm 4.35   MCV Latest Range: 80-100 fL 94   MCH, POC Latest Range: 28-35 pg 34   MCHC Latest Range: 32-36 gm/dL 36   RDW Latest Range: 11.0-14.0 % 11.2   MPV Latest Range: 6.0-10.0 fL 6.6   Results for Courtney Callahan, Courtney Callahan (MRN [id number]) as of 10/08/2013 08:29   Ref. Range 10/08/2013 07:34 10/08/2013 07:39   Cholesterol Latest Range: 75-199 mg/dL  829 (H)   HDL Latest Range: 45-65 mg/dL  69 (H)   Calculated LDL No range found  173   Triglycerides Latest Range: 10-150 mg/dL  562   VLDL Latest Range: 0-40   21   i-STAT Glucose Latest Range: 70-99 mg/dL 98     i-STAT Sodium Latest Range: 135-145 mMol/L 143    i-STAT Potassium Latest Range: 3.5-5.3 mMol/L 4.0    i-STAT Chloride Latest Range: 98-112 mMol/L 103    i-STAT BUN Latest Range: 6-20 mg/dL 10    i-STAT Creatinine Latest Range: 0.60-1.10 mg/dL 1.30    i-STAT Hematocrit Latest Range: 36.0-48.0 % 41.0    i-STAT Hemoglobin Latest Range: 12.0-16.0 gm/dL 86.5    TCO2, ISTAT Latest Range: 24-29 mMol/L 27    Anion Gap, ISTAT Latest Range: 7-16  17.0 (H)    Coronary Heart Disease Risk No range found  3.81     Filed Vitals:    10/08/13 0716 10/08/13 0739   BP:  114/67   Pulse:  64   Temp:  96.7 F (35.9 C)   TempSrc:  Oral   Resp:  13   Height: 1.626 m (5\' 4" )    Weight: 66.679 kg (147 lb)    SpO2:  97%   WDWN female in NAD A/O  LuNGS CTA Bilat   CV RRR S1S2 no murmur rub gallop  Neck supple no JVD, bruits noted  Abd Soft, NT ND +BS  LE no edema  2+ pulses  SKin intact  Appropriate affect, oriented x 4    ASSESSMENT:  1. DOE  2. Pul fibrosis  3. OSA  4. Palpitations   5. P. Arthritis  6. schrogens  7. new HLP    PLAN  1. For RHC  2. Informed consent obtained    E.Tynetta Bachmann PAC/ Troy Sine, MD

## 2013-10-08 NOTE — Discharge Instructions (Signed)
Post Heart and Vascular Groin Management Discharge Instructions    1. Keep the dressing in place and dry for the first 24 hours.    2. After 24 hours you may shower, remove dressing, gently clean area with soap and water while standing in the shower. Pat dry with towel.    3. Do not apply any powders, lotions or ointments. Do not cover area with another dressing or band-aid  .   4. Keep site clean and dry to prevent infection.    5. Do not sit in tub water or pool of water for 3 days.    6.No lifting, pushing or pulling anything greater than 10 lbs for 2 days.    7. If you experience bleeding, severe pain, or a sudden painful large knot at the site:      A.) Lie down      B.) Apply firm pressure at the site with your hand.      C.) Contact the rescue squad by calling 911.    8. Drink additional fluids the evening after your procedure unless told otherwise.    9. You may ride in a car, but do not drive yourself for 24 hours after the procedure.    10. If you have any questions, call [phone]    11. You will be receiving a customer care survey in the mail about your care. Your feedback is very important to [place]. Please take a few minutes to fill it out and send it ba

## 2013-10-08 NOTE — Progress Notes (Signed)
Patient admitted to Cath Holding, IV inserted and labs completed, EKG complete, Tele monitor applied, patient resting quietly, will continue to monitor.

## 2014-02-01 ENCOUNTER — Other Ambulatory Visit (INDEPENDENT_AMBULATORY_CARE_PROVIDER_SITE_OTHER): Payer: Self-pay | Admitting: Hospitalist

## 2014-02-01 DIAGNOSIS — R918 Other nonspecific abnormal finding of lung field: Secondary | ICD-10-CM

## 2014-02-23 ENCOUNTER — Encounter (INDEPENDENT_AMBULATORY_CARE_PROVIDER_SITE_OTHER): Payer: Medicare Other | Admitting: Pulmonary Disease

## 2014-03-01 ENCOUNTER — Encounter (INDEPENDENT_AMBULATORY_CARE_PROVIDER_SITE_OTHER): Payer: Medicare Other | Admitting: Pulmonary Disease

## 2014-04-05 ENCOUNTER — Encounter (INDEPENDENT_AMBULATORY_CARE_PROVIDER_SITE_OTHER): Payer: Self-pay

## 2014-04-05 ENCOUNTER — Ambulatory Visit (INDEPENDENT_AMBULATORY_CARE_PROVIDER_SITE_OTHER)
Admission: RE | Admit: 2014-04-05 | Discharge: 2014-04-05 | Disposition: A | Discharge status: Home / Self Care | Payer: Medicare Other | Source: Ambulatory Visit | Attending: Hospitalist | Admitting: Hospitalist

## 2014-04-05 DIAGNOSIS — R918 Other nonspecific abnormal finding of lung field: Secondary | ICD-10-CM

## 2014-05-26 ENCOUNTER — Ambulatory Visit (INDEPENDENT_AMBULATORY_CARE_PROVIDER_SITE_OTHER)
Admission: RE | Admit: 2014-05-26 | Discharge: 2014-05-26 | Disposition: A | Discharge status: Home / Self Care | Payer: Medicare Other | Source: Ambulatory Visit | Attending: Internal Medicine | Admitting: Internal Medicine

## 2014-05-26 ENCOUNTER — Other Ambulatory Visit (INDEPENDENT_AMBULATORY_CARE_PROVIDER_SITE_OTHER): Payer: Self-pay | Admitting: Internal Medicine

## 2014-05-26 DIAGNOSIS — M25562 Pain in left knee: Secondary | ICD-10-CM

## 2014-05-26 DIAGNOSIS — M25561 Pain in right knee: Secondary | ICD-10-CM

## 2014-06-23 ENCOUNTER — Other Ambulatory Visit: Payer: Self-pay | Admitting: Internal Medicine

## 2014-06-23 DIAGNOSIS — Z1231 Encounter for screening mammogram for malignant neoplasm of breast: Secondary | ICD-10-CM

## 2014-07-14 ENCOUNTER — Ambulatory Visit
Admission: RE | Admit: 2014-07-14 | Discharge: 2014-07-14 | Disposition: A | Discharge status: Home / Self Care | Payer: Medicare Other | Source: Ambulatory Visit | Attending: Internal Medicine | Admitting: Internal Medicine

## 2014-07-14 DIAGNOSIS — Z1231 Encounter for screening mammogram for malignant neoplasm of breast: Secondary | ICD-10-CM | POA: Insufficient documentation

## 2015-04-04 ENCOUNTER — Other Ambulatory Visit (INDEPENDENT_AMBULATORY_CARE_PROVIDER_SITE_OTHER): Payer: Self-pay | Admitting: Hospitalist

## 2015-04-13 ENCOUNTER — Other Ambulatory Visit (INDEPENDENT_AMBULATORY_CARE_PROVIDER_SITE_OTHER): Payer: Self-pay | Admitting: Hospitalist

## 2015-04-13 DIAGNOSIS — R918 Other nonspecific abnormal finding of lung field: Secondary | ICD-10-CM

## 2015-04-14 ENCOUNTER — Other Ambulatory Visit (INDEPENDENT_AMBULATORY_CARE_PROVIDER_SITE_OTHER): Payer: Self-pay | Admitting: Hospitalist

## 2015-04-14 ENCOUNTER — Ambulatory Visit (INDEPENDENT_AMBULATORY_CARE_PROVIDER_SITE_OTHER)
Admission: RE | Admit: 2015-04-14 | Discharge: 2015-04-14 | Disposition: A | Discharge status: Home / Self Care | Payer: Medicare Other | Source: Ambulatory Visit | Attending: Hospitalist | Admitting: Hospitalist

## 2015-04-14 ENCOUNTER — Encounter (INDEPENDENT_AMBULATORY_CARE_PROVIDER_SITE_OTHER): Payer: Self-pay

## 2015-04-14 DIAGNOSIS — R918 Other nonspecific abnormal finding of lung field: Secondary | ICD-10-CM

## 2015-05-27 LAB — BASIC METABOLIC PANEL
BUN: 13 mg/dL (ref 4–21)
Creatinine: 0.7 mg/dL (ref 0.5–1.1)
GLUCOSE: 98 mg/dL

## 2015-05-27 LAB — LIPID PANEL
Cholesterol: 260 mg/dL — AB (ref 0–200)
HDL: 85 mg/dL — AB (ref 35–70)
LDL Cholesterol: 155 mg/dL
Triglycerides: 98 mg/dL (ref 40–160)

## 2015-05-27 LAB — TSH: TSH: 2.79 u[IU]/mL (ref 0.41–5.90)

## 2015-05-27 LAB — CBC AND DIFFERENTIAL
HEMATOCRIT: 44 % (ref 36–46)
HEMOGLOBIN: 14.7 g/dL (ref 12.0–16.0)
Platelets: 240 10*3/uL (ref 150–399)
WBC: 5.2 10*3/mL

## 2015-05-27 LAB — HEPATIC FUNCTION PANEL
ALT: 60 U/L — AB (ref 7–35)
AST: 19 U/L (ref 13–35)

## 2015-09-06 ENCOUNTER — Encounter: Payer: Self-pay | Admitting: Internal Medicine

## 2015-09-06 ENCOUNTER — Ambulatory Visit (INDEPENDENT_AMBULATORY_CARE_PROVIDER_SITE_OTHER): Payer: Medicare Other | Admitting: Internal Medicine

## 2015-09-06 VITALS — BP 118/70 | HR 76 | Ht 64.0 in | Wt 150.8 lb

## 2015-09-06 DIAGNOSIS — E559 Vitamin D deficiency, unspecified: Secondary | ICD-10-CM

## 2015-09-06 DIAGNOSIS — Z86010 Personal history of colonic polyps: Secondary | ICD-10-CM | POA: Diagnosis not present

## 2015-09-06 DIAGNOSIS — G2581 Restless legs syndrome: Secondary | ICD-10-CM | POA: Diagnosis not present

## 2015-09-06 DIAGNOSIS — M3500 Sicca syndrome, unspecified: Secondary | ICD-10-CM | POA: Diagnosis not present

## 2015-09-06 DIAGNOSIS — H3530 Unspecified macular degeneration: Secondary | ICD-10-CM | POA: Diagnosis not present

## 2015-09-06 DIAGNOSIS — Z860101 Personal history of adenomatous and serrated colon polyps: Secondary | ICD-10-CM

## 2015-09-06 DIAGNOSIS — J8410 Pulmonary fibrosis, unspecified: Secondary | ICD-10-CM | POA: Diagnosis not present

## 2015-09-06 DIAGNOSIS — L4050 Arthropathic psoriasis, unspecified: Secondary | ICD-10-CM

## 2015-09-06 MED ORDER — GABAPENTIN 100 MG PO CAPS: 100.0000 mg | ORAL_CAPSULE | Freq: Every day | ORAL | Status: DC

## 2015-09-06 NOTE — Progress Notes (Signed)
Date:  09/06/2015   Name:  Joanna Chapman   DOB:  [birth year]   MRN:  [id number]   Chief Complaint: New Evaluation New patient here to establish with PCP. She moved to [place] from [place] to be near family.  She is currently living in a senior community with her husband.  Arthritis Presents for follow-up visit. She complains of pain and joint warmth. Affected locations include the left knee, right knee, left hip and right hip. Associated symptoms include dry eyes and dry mouth. Pertinent negatives include no fatigue, fever or rash. Past treatments include methotrexate and corticosteroids (takes 5 days of prednisone 40 mg every 3 months for arthritis flare.  She went off MTX when she was dx'd with pulmonary fibrosis but did not do well.  She was able to resume MTX and had stable lung findings.  ).  Insomnia Primary symptoms: sleep disturbance, frequent awakening.  The onset quality is undetermined. The problem occurs nightly. Relieved by: Lorrin Mais 2.5 mg as needed and nightly gabapentin.  Hyperlipidemia This is a chronic problem. Recent lipid tests were reviewed and are high (TC 260 HDL 85 LDL 155 TG 98). Associated symptoms include myalgias (neck and shoulders) and shortness of breath (stable). Pertinent negatives include no chest pain. Treatments tried: tried statin but got muscle pain. Compliance problems include medication side effects.   Previously seen by Rheumatology in [place]. Restless leg - on gabapentin and mild foot neuropathy.  She has had some trouble with sleep and tried higher dose of gabapentin. She did not tolerate 300 mg at bedtime so return to 100 mg at bedtime and an additional 100 mg in the middle of the night if needed.  Macular Degeneration - she's been seen in the past for this by a retinal specialist. She's had several retinal injections starting in 2013. She continues to see a retinal specialist every 4 months. She plans to make an appointment to establish with the [facility].  Pulmonary Fibrosis - she was diagnosed with pulmonary fibrosis in 2015. This was determined by chest x-ray and CT of the chest. She subsequently was seen by pulmonology who also ruled out cardiac disease with a stress test and the left heart catheterization. Should been monitored closely while on methotrexate. Her last pulmonary function test and CT scan was in November 2016. She will establish with the pulmonologist of her choice.  Vitamin D def - she reports a level of vitamin D 30 to last fall. She took 3 months of high-dose vitamin D 50,000 units weekly with a repeat level that was low were at 26. Currently taking 4000 international units daily. She believes this has helped her restless leg and wonders if she should have levels checked.   Review of Systems  Constitutional: Positive for unexpected weight change (lost about 15 lbs with effort). Negative for fever, chills and fatigue.  HENT: Negative for ear pain, tinnitus, trouble swallowing and voice change.   Eyes: Positive for visual disturbance. Negative for pain and discharge.  Respiratory: Positive for shortness of breath (stable). Negative for chest tightness and wheezing.   Cardiovascular: Negative for chest pain, palpitations and leg swelling.  Gastrointestinal: Negative for nausea, vomiting, abdominal pain and constipation.  Genitourinary: Negative for hematuria and difficulty urinating.  Musculoskeletal: Positive for myalgias (neck and shoulders), arthralgias (mostly hips and knees) and arthritis.  Skin: Negative for rash and wound.  Neurological: Positive for headaches (improved with gabapentin). Negative for dizziness, tremors, seizures, syncope and numbness.  Hematological:  Negative for adenopathy.  Psychiatric/Behavioral: Positive for sleep disturbance. Negative for confusion and dysphoric mood. The patient has insomnia.     Patient Active Problem List   Diagnosis Date Noted  . Psoriatic arthritis ([place])  09/06/2015  . Sjogren's syndrome ([place]) 09/06/2015  . Pulmonary fibrosis ([place]) 09/06/2015  . Restless leg syndrome, controlled 09/06/2015  . Macular degeneration, bilateral 09/06/2015  . Vitamin D deficiency 09/06/2015  . Hx of adenomatous colonic polyps 09/06/2015    Prior to Admission medications   Medication Sig Start Date End Date Taking? Authorizing Provider  cycloSPORINE (RESTASIS) 0.05 % ophthalmic emulsion Place 1 drop into both eyes 2 (two) times daily.   Yes Historical Provider, MD  folic acid (FOLVITE) 1 MG tablet Take 1 mg by mouth daily.   Yes Historical Provider, MD  gabapentin (NEURONTIN) 100 MG capsule Take 200 mg by mouth at bedtime.   Yes Historical Provider, MD  methotrexate (RHEUMATREX) 2.5 MG tablet Take 8 tablets by mouth once a week. 06/19/15  Yes Historical Provider, MD  pilocarpine (SALAGEN) 5 MG tablet Take 5 mg by mouth 3 (three) times daily.    Yes Historical Provider, MD  predniSONE (DELTASONE) 10 MG tablet Take 10 mg by mouth as needed.   Yes Historical Provider, MD  tacrolimus (PROTOPIC) 0.1 % ointment Apply topically 2 (two) times daily.   Yes Historical Provider, MD  zolpidem (AMBIEN) 5 MG tablet Take 5 mg by mouth at bedtime as needed for sleep.   Yes Historical Provider, MD    Allergies  Allergen Reactions  . Rutherford Nail [Apremilast] Other (See Comments)    Vivid Nightmares  . Penicillins Hives    Past Surgical History  Procedure Laterality Date  . Left heart cath  10/08/2013  . Bunionectomy Right 1990's  . Tubal ligation  1970's    Social History  Substance Use Topics  . Smoking status: Never Smoker   . Smokeless tobacco: None  . Alcohol Use: No     Medication list has been reviewed and updated.   Physical Exam  Constitutional: She is oriented to person, place, and time. She appears well-developed. No distress.  HENT:  Head: Normocephalic and atraumatic.  Neck: Normal range of motion. No thyromegaly present.  Cardiovascular: Normal rate,  regular rhythm and normal heart sounds.   Pulmonary/Chest: Effort normal and breath sounds normal. No respiratory distress. She has no wheezes. She has no rales.  Abdominal: Soft. Bowel sounds are normal. She exhibits no distension and no mass. There is no tenderness. There is no rebound and no guarding.  Musculoskeletal: She exhibits no edema.       Right hip: She exhibits normal range of motion and no tenderness.       Left hip: She exhibits normal range of motion and no tenderness.       Right knee: She exhibits normal range of motion and no effusion. No tenderness found.       Left knee: She exhibits normal range of motion and no effusion. No tenderness found.  Lymphadenopathy:    She has no cervical adenopathy.  Neurological: She is alert and oriented to person, place, and time. She has normal strength and normal reflexes.  Skin: Skin is warm and dry. No rash noted.  Psychiatric: She has a normal mood and affect. Her behavior is normal. Thought content normal.  Nursing note and vitals reviewed.   BP 118/70 mmHg  Pulse 76  Ht 5\' 4"  (1.626 m)  Wt 150 lb 12.8 oz (68.402  kg)  BMI 25.87 kg/m2  Assessment and Plan: 1. Psoriatic arthritis ([place]) Doing well on MTX; most recent labs in February Will establish with Rheumatology at [facility]  2. Sjogren's syndrome ([place]) Stable on pilocarpine  3. Pulmonary fibrosis ([place]) Stable Needs annual follow up with Pulmonologist with PFTs and CT chest  4. Restless leg syndrome, controlled - gabapentin (NEURONTIN) 100 MG capsule; Take 1 capsule (100 mg total) by mouth at bedtime.  Dispense: 180 capsule; Refill: 1  5. Macular degeneration, bilateral To establish with [facility]  6. Vitamin D deficiency Continue 4000 IU daily  7. Hx of adenomatous colonic polyps Will need follow up in several years Patient will look for previous report for reference  Will follow up for MAW in December.  Return sooner if needed.  Halina Maidens, MD [facility]  09/06/2015

## 2015-09-17 HISTORY — PX: OTHER SURGICAL HISTORY: SHX169

## 2015-12-14 NOTE — Progress Notes (Deleted)
    Date:  06/07/2016   Name:  Joanna Chapman   DOB:  1946/01/02   MRN:  [id number]   Chief Complaint: No chief complaint on file.    Review of Systems  Patient Active Problem List   Diagnosis Date Noted  . Psoriatic arthritis ([place]) 09/06/2015  . Sjogren's syndrome ([place]) 09/06/2015  . Pulmonary fibrosis ([place]) 09/06/2015  . Restless leg syndrome, controlled 09/06/2015  . Macular degeneration, bilateral 09/06/2015  . Vitamin D deficiency 09/06/2015  . Hx of adenomatous colonic polyps 09/06/2015    Prior to Admission medications   Medication Sig Start Date End Date Taking? Authorizing Provider  Cholecalciferol (VITAMIN D) 2000 units CAPS Take 2 capsules by mouth daily.    Historical Provider, MD  cycloSPORINE (RESTASIS) 0.05 % ophthalmic emulsion Place 1 drop into both eyes 2 (two) times daily.    Historical Provider, MD  folic acid (FOLVITE) 1 MG tablet Take 1 mg by mouth daily.    Historical Provider, MD  gabapentin (NEURONTIN) 100 MG capsule Take 1 capsule (100 mg total) by mouth at bedtime. 09/06/15   Glean Hess, MD  methotrexate (RHEUMATREX) 2.5 MG tablet Take 8 tablets by mouth once a week. 06/19/15   Historical Provider, MD  pilocarpine (SALAGEN) 5 MG tablet Take 5 mg by mouth 3 (three) times daily.     Historical Provider, MD  predniSONE (DELTASONE) 10 MG tablet Take 10 mg by mouth as needed.    Historical Provider, MD  tacrolimus (PROTOPIC) 0.1 % ointment Apply topically 2 (two) times daily.    Historical Provider, MD  zolpidem (AMBIEN) 5 MG tablet Take 5 mg by mouth at bedtime as needed for sleep.    Historical Provider, MD    Allergies  Allergen Reactions  . Rutherford Nail [Apremilast] Other (See Comments)    Vivid Nightmares  . Penicillins Hives    Past Surgical History  Procedure Laterality Date  . Left heart cath  10/08/2013  . Bunionectomy Right 1990's  . Tubal ligation  1970's    Social History  Substance Use Topics  . Smoking status: Never Smoker   .  Smokeless tobacco: Not on file  . Alcohol Use: No     Medication list has been reviewed and updated.   Physical Exam  There were no vitals taken for this visit.  Assessment and Plan: Erroneous encounter.

## 2016-03-13 DIAGNOSIS — M1990 Unspecified osteoarthritis, unspecified site: Secondary | ICD-10-CM | POA: Insufficient documentation

## 2016-04-01 NOTE — Progress Notes (Signed)
TB skin test placed.

## 2016-04-23 ENCOUNTER — Other Ambulatory Visit: Payer: Self-pay | Admitting: Obstetrics and Gynecology

## 2016-04-23 DIAGNOSIS — N6459 Other signs and symptoms in breast: Secondary | ICD-10-CM

## 2016-04-24 ENCOUNTER — Other Ambulatory Visit: Payer: Self-pay | Admitting: *Deleted

## 2016-04-24 ENCOUNTER — Ambulatory Visit
Admission: RE | Admit: 2016-04-24 | Discharge: 2016-04-24 | Disposition: A | Discharge status: Home / Self Care | Payer: Self-pay | Source: Ambulatory Visit | Attending: *Deleted | Admitting: *Deleted

## 2016-04-24 DIAGNOSIS — Z9289 Personal history of other medical treatment: Secondary | ICD-10-CM

## 2016-05-09 ENCOUNTER — Ambulatory Visit
Admission: RE | Admit: 2016-05-09 | Discharge: 2016-05-09 | Disposition: A | Discharge status: Home / Self Care | Payer: Medicare Other | Source: Ambulatory Visit | Attending: Obstetrics and Gynecology | Admitting: Obstetrics and Gynecology

## 2016-05-09 ENCOUNTER — Encounter: Payer: Self-pay | Admitting: Radiology

## 2016-05-09 DIAGNOSIS — N6459 Other signs and symptoms in breast: Secondary | ICD-10-CM

## 2016-06-07 ENCOUNTER — Encounter: Payer: Medicare Other | Admitting: Internal Medicine

## 2016-06-22 DIAGNOSIS — M0600 Rheumatoid arthritis without rheumatoid factor, unspecified site: Secondary | ICD-10-CM | POA: Insufficient documentation

## 2016-08-23 ENCOUNTER — Other Ambulatory Visit: Payer: Self-pay | Admitting: Medical Oncology

## 2016-08-23 ENCOUNTER — Ambulatory Visit
Admission: RE | Admit: 2016-08-23 | Discharge: 2016-08-23 | Disposition: A | Discharge status: Home / Self Care | Payer: Medicare Other | Source: Ambulatory Visit | Attending: Medical Oncology | Admitting: Medical Oncology

## 2016-08-23 DIAGNOSIS — M79604 Pain in right leg: Secondary | ICD-10-CM

## 2016-10-16 DIAGNOSIS — Z789 Other specified health status: Secondary | ICD-10-CM | POA: Insufficient documentation

## 2016-11-21 ENCOUNTER — Encounter: Payer: Self-pay | Admitting: *Deleted

## 2016-11-22 ENCOUNTER — Ambulatory Visit: Payer: Medicare Other | Admitting: Anesthesiology

## 2016-11-22 ENCOUNTER — Encounter
Admission: RE | Disposition: A | Discharge status: Home / Self Care | Payer: Self-pay | Source: Ambulatory Visit | Attending: Gastroenterology

## 2016-11-22 ENCOUNTER — Ambulatory Visit
Admission: RE | Admit: 2016-11-22 | Discharge: 2016-11-22 | Disposition: A | Discharge status: Home / Self Care | Payer: Medicare Other | Source: Ambulatory Visit | Attending: Gastroenterology | Admitting: Gastroenterology

## 2016-11-22 DIAGNOSIS — Z791 Long term (current) use of non-steroidal anti-inflammatories (NSAID): Secondary | ICD-10-CM | POA: Diagnosis not present

## 2016-11-22 DIAGNOSIS — Z7952 Long term (current) use of systemic steroids: Secondary | ICD-10-CM | POA: Diagnosis not present

## 2016-11-22 DIAGNOSIS — Z1211 Encounter for screening for malignant neoplasm of colon: Secondary | ICD-10-CM | POA: Insufficient documentation

## 2016-11-22 DIAGNOSIS — K635 Polyp of colon: Secondary | ICD-10-CM | POA: Insufficient documentation

## 2016-11-22 DIAGNOSIS — Z880 Allergy status to penicillin: Secondary | ICD-10-CM | POA: Diagnosis not present

## 2016-11-22 DIAGNOSIS — D122 Benign neoplasm of ascending colon: Secondary | ICD-10-CM | POA: Insufficient documentation

## 2016-11-22 DIAGNOSIS — J8410 Pulmonary fibrosis, unspecified: Secondary | ICD-10-CM | POA: Insufficient documentation

## 2016-11-22 DIAGNOSIS — Z888 Allergy status to other drugs, medicaments and biological substances status: Secondary | ICD-10-CM | POA: Diagnosis not present

## 2016-11-22 DIAGNOSIS — H35033 Hypertensive retinopathy, bilateral: Secondary | ICD-10-CM | POA: Insufficient documentation

## 2016-11-22 DIAGNOSIS — L4050 Arthropathic psoriasis, unspecified: Secondary | ICD-10-CM | POA: Insufficient documentation

## 2016-11-22 DIAGNOSIS — Z79899 Other long term (current) drug therapy: Secondary | ICD-10-CM | POA: Insufficient documentation

## 2016-11-22 DIAGNOSIS — K5730 Diverticulosis of large intestine without perforation or abscess without bleeding: Secondary | ICD-10-CM | POA: Insufficient documentation

## 2016-11-22 DIAGNOSIS — Z86010 Personal history of colonic polyps: Secondary | ICD-10-CM | POA: Diagnosis not present

## 2016-11-22 DIAGNOSIS — G2581 Restless legs syndrome: Secondary | ICD-10-CM | POA: Insufficient documentation

## 2016-11-22 DIAGNOSIS — M3500 Sicca syndrome, unspecified: Secondary | ICD-10-CM | POA: Diagnosis not present

## 2016-11-22 DIAGNOSIS — H353290 Exudative age-related macular degeneration, unspecified eye, stage unspecified: Secondary | ICD-10-CM | POA: Diagnosis not present

## 2016-11-22 DIAGNOSIS — G4700 Insomnia, unspecified: Secondary | ICD-10-CM | POA: Insufficient documentation

## 2016-11-22 DIAGNOSIS — H04123 Dry eye syndrome of bilateral lacrimal glands: Secondary | ICD-10-CM | POA: Insufficient documentation

## 2016-11-22 HISTORY — DX: Personal history of colonic polyps: Z86.010

## 2016-11-22 HISTORY — DX: Hypertensive retinopathy, bilateral: H35.033

## 2016-11-22 HISTORY — DX: Cortical age-related cataract, unspecified eye: H25.019

## 2016-11-22 HISTORY — DX: Dry eye syndrome of bilateral lacrimal glands: H04.123

## 2016-11-22 HISTORY — PX: COLONOSCOPY WITH PROPOFOL: SHX5780

## 2016-11-22 HISTORY — DX: Personal history of colon polyps, unspecified: Z86.0100

## 2016-11-22 SURGERY — COLONOSCOPY WITH PROPOFOL
Anesthesia: General

## 2016-11-22 MED ORDER — SODIUM CHLORIDE 0.9 % IV SOLN
INTRAVENOUS | Status: DC
  Administered 2016-11-22: 1000 mL via INTRAVENOUS

## 2016-11-22 MED ORDER — PROPOFOL 10 MG/ML IV BOLUS
INTRAVENOUS | Status: DC | PRN
  Administered 2016-11-22: 30 mg via INTRAVENOUS
  Administered 2016-11-22: 50 mg via INTRAVENOUS

## 2016-11-22 MED ORDER — PROPOFOL 10 MG/ML IV BOLUS
INTRAVENOUS | Status: AC
  Filled 2016-11-22: qty 20

## 2016-11-22 MED ORDER — EPHEDRINE SULFATE 50 MG/ML IJ SOLN
INTRAMUSCULAR | Status: AC
  Filled 2016-11-22: qty 1

## 2016-11-22 MED ORDER — SODIUM CHLORIDE 0.9 % IV SOLN: INTRAVENOUS | Status: DC

## 2016-11-22 MED ORDER — PROPOFOL 500 MG/50ML IV EMUL
INTRAVENOUS | Status: AC
  Filled 2016-11-22: qty 50

## 2016-11-22 MED ORDER — PROPOFOL 500 MG/50ML IV EMUL
INTRAVENOUS | Status: DC | PRN
  Administered 2016-11-22: 150 ug/kg/min via INTRAVENOUS

## 2016-11-22 MED ORDER — EPHEDRINE SULFATE 50 MG/ML IJ SOLN
INTRAMUSCULAR | Status: DC | PRN
  Administered 2016-11-22 (×2): 10 mg via INTRAVENOUS

## 2016-11-22 NOTE — H&P (Signed)
Outpatient short stay form Pre-procedure 11/22/2016 11:18 AM Joanna Sails MD  Primary Physician: Dr. Janene Harvey  Reason for visit:  Colonoscopy  History of present illness:  Patient is a 71 year old female presenting today as above. She has personal history of adenomatous colon polyps with her last procedure being done about 5 years ago. She tolerated her prep well. She takes no aspirin or blood thinning agents.    Current Facility-Administered Medications:  .  0.9 %  sodium chloride infusion, , Intravenous, Continuous, Joanna Sails, MD, Last Rate: 20 mL/hr at 11/22/16 1027, 1,000 mL at 11/22/16 1027 .  0.9 %  sodium chloride infusion, , Intravenous, Continuous, Joanna Sails, MD  Prescriptions Prior to Admission  Medication Sig Dispense Refill Last Dose  . Cholecalciferol (VITAMIN D) 2000 units CAPS Take 2 capsules by mouth daily.   Past Week at Unknown time  . cycloSPORINE (RESTASIS) 0.05 % ophthalmic emulsion Place 1 drop into both eyes 2 (two) times daily.   Past Week at Unknown time  . diclofenac sodium (VOLTAREN) 1 % GEL Apply topically 4 (four) times daily.   Past Week at Unknown time  . ferrous sulfate 325 (65 FE) MG tablet Take 325 mg by mouth daily with breakfast.   Past Week at Unknown time  . folic acid (FOLVITE) 1 MG tablet Take 1 mg by mouth daily.   Past Week at Unknown time  . magnesium 30 MG tablet Take 200 mg by mouth 2 (two) times daily.   Past Week at Unknown time  . Melatonin 1 MG TABS Take 1 mg by mouth at bedtime as needed.   Past Week at Unknown time  . meloxicam (MOBIC) 7.5 MG tablet Take 7.5 mg by mouth daily.   Past Week at Unknown time  . methotrexate (RHEUMATREX) 2.5 MG tablet Take 8 tablets by mouth once a week.   Past Week at Unknown time  . Multiple Vitamins-Minerals (PRESERVISION AREDS 2) CAPS Take by mouth.   Past Week at Unknown time  . pilocarpine (SALAGEN) 5 MG tablet Take 5 mg by mouth 3 (three) times daily.    Past Week at Unknown time  .  predniSONE (DELTASONE) 10 MG tablet Take 10 mg by mouth as needed.   Past Week at Unknown time  . tacrolimus (PROTOPIC) 0.1 % ointment Apply topically 2 (two) times daily.   Past Week at Unknown time  . traZODone (DESYREL) 50 MG tablet Take 50 mg by mouth at bedtime.   Past Week at Unknown time  . zolpidem (AMBIEN) 5 MG tablet Take 5 mg by mouth at bedtime as needed for sleep.   Past Week at Unknown time  . gabapentin (NEURONTIN) 100 MG capsule Take 1 capsule (100 mg total) by mouth at bedtime. (Patient not taking: Reported on 11/21/2016) 180 capsule 1 Completed Course at Unknown time     Allergies  Allergen Reactions  . Rutherford Nail [Apremilast] Other (See Comments)    Vivid Nightmares  . Penicillins Hives     Past Medical History:  Diagnosis Date  . Cataract cortical, senile   . Dry eye syndrome, bilateral   . History of colon polyps   . Insomnia   . Macular degeneration, wet ([place])   . Psoriatic arthritis ([place])   . Pulmonary fibrosis ([place])   . Restless leg syndrome   . Retinopathy, hypertensive, bilateral   . Sjogren's disease ([place])     Review of systems:      Physical Exam  Heart and lungs: Regular rate and rhythm without rub or gallop, lungs are bilaterally clear.    HEENT: Normocephalic atraumatic eyes are anicteric    Other:     Pertinant exam for procedure: Soft nontender nondistended bowel sounds positive and normoactive    Planned proceedures: Colonoscopy and indicated procedures. I have discussed the risks benefits and complications of procedures to include not limited to bleeding, infection, perforation and the risk of sedation and the patient wishes to proceed.    Joanna Sails, MD Gastroenterology 11/22/2016  11:18 AM

## 2016-11-22 NOTE — Op Note (Signed)
[facility] Gastroenterology Patient Name: Joanna Chapman Procedure Date: 11/22/2016 11:18 AM MRN: [id number] Account #: [id number] Date of Birth: 10-Sep-1945 Admit Type: Outpatient Age: 71 Room: [facility] ENDO ROOM 1 Gender: Female Note Status: Finalized Procedure:            Colonoscopy Indications:          Personal history of colonic polyps Providers:            Lollie Sails, MD Referring MD:         Ane Payment, MD (Referring MD) Medicines:            Monitored Anesthesia Care Complications:        No immediate complications. Procedure:            Pre-Anesthesia Assessment:                       - ASA Grade Assessment: II - A patient with mild                        systemic disease.                       After obtaining informed consent, the colonoscope was                        passed under direct vision. Throughout the procedure,                        the patient's blood pressure, pulse, and oxygen                        saturations were monitored continuously. The                        Colonoscope was introduced through the anus and                        advanced to the the cecum, identified by appendiceal                        orifice and ileocecal valve. The colonoscopy was                        performed with moderate difficulty. Successful                        completion of the procedure was aided by changing the                        patient to a supine position, changing the patient to a                        prone position and using manual pressure. The patient                        tolerated the procedure well. The quality of the bowel                        preparation was good. Findings:      Two sessile polyps were found in the proximal ascending  colon. The       polyps were less than 1 mm in size. These polyps were removed with a       cold biopsy forceps. Resection and retrieval were complete.      Four sessile polyps were  found in the recto-sigmoid colon. The polyps       were 1 to 2 mm in size. These polyps were removed with a cold biopsy       forceps. Resection and retrieval were complete.      A few small-mouthed diverticula were found in the sigmoid colon and       distal descending colon.      The retroflexed view of the distal rectum and anal verge was normal and       showed no anal or rectal abnormalities.      The digital rectal exam was normal. Impression:           - Two less than 1 mm polyps in the proximal ascending                        colon, removed with a cold biopsy forceps. Resected and                        retrieved.                       - Four 1 to 2 mm polyps at the recto-sigmoid colon,                        removed with a cold biopsy forceps. Resected and                        retrieved.                       - Diverticulosis in the sigmoid colon and in the distal                        descending colon.                       - The distal rectum and anal verge are normal on                        retroflexion view. Recommendation:       - Discharge patient to home.                       - Telephone GI clinic for pathology results in 1 week. Procedure Code(s):    --- Professional ---                       [phone], Colonoscopy, flexible; with biopsy, single or                        multiple Diagnosis Code(s):    --- Professional ---                       D12.2, Benign neoplasm of ascending colon                       D12.7, Benign neoplasm of rectosigmoid  junction                       Z86.010, Personal history of colonic polyps                       K57.30, Diverticulosis of large intestine without                        perforation or abscess without bleeding CPT copyright 2016 American Medical Association. All rights reserved. The codes documented in this report are preliminary and upon coder review may  be revised to meet current compliance requirements. Lollie Sails, MD 11/22/2016 11:59:30 AM This report has been signed electronically. Number of Addenda: 0 Note Initiated On: 11/22/2016 11:18 AM Scope Withdrawal Time: 0 hours 13 minutes 33 seconds  Total Procedure Duration: 0 hours 32 minutes 28 seconds       [facility]

## 2016-11-22 NOTE — Anesthesia Procedure Notes (Signed)
Date/Time: 11/22/2016 11:18 AM Performed by: Darlyne Russian Pre-anesthesia Checklist: Patient identified, Emergency Drugs available, Suction available, Patient being monitored and Timeout performed Patient Re-evaluated:Patient Re-evaluated prior to inductionOxygen Delivery Method: Nasal cannula

## 2016-11-22 NOTE — Anesthesia Post-op Follow-up Note (Cosign Needed)
Anesthesia QCDR form completed.        

## 2016-11-22 NOTE — Transfer of Care (Signed)
Immediate Anesthesia Transfer of Care Note  Patient: Joanna Chapman  Procedure(s) Performed: Procedure(s): COLONOSCOPY WITH PROPOFOL (N/A)  Patient Location: PACU  Anesthesia Type:General  Level of Consciousness: awake, alert  and oriented  Airway & Oxygen Therapy: Patient Spontanous Breathing and Patient connected to nasal cannula oxygen  Post-op Assessment: Report given to RN and Post -op Vital signs reviewed and stable  Post vital signs: Reviewed and stable  Last Vitals:  Vitals:   11/22/16 1017 11/22/16 1204  BP: 120/64 (!) 102/50  Pulse: 66 64  Resp: 16 15  Temp: 36.6 C 36.3 C    Last Pain:  Vitals:   11/22/16 1204  TempSrc: Tympanic         Complications: No apparent anesthesia complications

## 2016-11-22 NOTE — Anesthesia Postprocedure Evaluation (Signed)
Anesthesia Post Note  Patient: Joanna Chapman  Procedure(s) Performed: Procedure(s) (LRB): COLONOSCOPY WITH PROPOFOL (N/A)  Patient location during evaluation: PACU Anesthesia Type: General Level of consciousness: awake Pain management: pain level controlled Vital Signs Assessment: post-procedure vital signs reviewed and stable Respiratory status: spontaneous breathing Cardiovascular status: stable Anesthetic complications: no     Last Vitals:  Vitals:   11/22/16 1017 11/22/16 1204  BP: 120/64 (!) 102/50  Pulse: 66 64  Resp: 16 15  Temp: 36.6 C 36.3 C    Last Pain:  Vitals:   11/22/16 1204  TempSrc: Tympanic                 VAN STAVEREN,Jasir Rother

## 2016-11-22 NOTE — Anesthesia Preprocedure Evaluation (Signed)
Anesthesia Evaluation  Patient identified by MRN, date of birth, ID band Patient awake    Reviewed: Allergy & Precautions, NPO status , Patient's Chart, lab work & pertinent test results  Airway Mallampati: III       Dental  (+) Teeth Intact, Caps   Pulmonary neg pulmonary ROS,    breath sounds clear to auscultation       Cardiovascular negative cardio ROS   Rhythm:Regular     Neuro/Psych negative neurological ROS     GI/Hepatic negative GI ROS, Neg liver ROS,   Endo/Other  negative endocrine ROS  Renal/GU negative Renal ROS     Musculoskeletal negative musculoskeletal ROS (+)   Abdominal Normal abdominal exam  (+)   Peds negative pediatric ROS (+)  Hematology negative hematology ROS (+)   Anesthesia Other Findings   Reproductive/Obstetrics                             Anesthesia Physical Anesthesia Plan  ASA: II  Anesthesia Plan: General   Post-op Pain Management:    Induction: Intravenous  PONV Risk Score and Plan: 1 and Ondansetron and Treatment may vary due to age  Airway Management Planned: Natural Airway  Additional Equipment:   Intra-op Plan:   Post-operative Plan:   Informed Consent: I have reviewed the patients History and Physical, chart, labs and discussed the procedure including the risks, benefits and alternatives for the proposed anesthesia with the patient or authorized representative who has indicated his/her understanding and acceptance.     Plan Discussed with: CRNA and Surgeon  Anesthesia Plan Comments:         Anesthesia Quick Evaluation

## 2016-11-23 ENCOUNTER — Encounter: Payer: Self-pay | Admitting: Gastroenterology

## 2016-11-23 LAB — SURGICAL PATHOLOGY

## 2017-10-17 ENCOUNTER — Other Ambulatory Visit: Payer: Self-pay | Admitting: Pediatrics

## 2017-10-17 DIAGNOSIS — Z1239 Encounter for other screening for malignant neoplasm of breast: Secondary | ICD-10-CM

## 2018-05-26 DIAGNOSIS — Z961 Presence of intraocular lens: Secondary | ICD-10-CM | POA: Insufficient documentation

## 2018-06-02 IMAGING — MG MM DIGITAL DIAGNOSTIC BILAT W/ TOMO W/ CAD
8 of 15 series · 8 of 35 positions shown · non-contrast
Comparison: Previous exam(s).

CLINICAL DATA: 70-year-old female with area of thickening felt by
the patient's referring clinician in the superior left breast at
11-12 o'clock. The patient does not feel any lumps.

EXAM:
2D DIGITAL DIAGNOSTIC BILATERAL MAMMOGRAM WITH CAD AND ADJUNCT TOMO
ULTRASOUND LEFT BREAST

[R MLO]
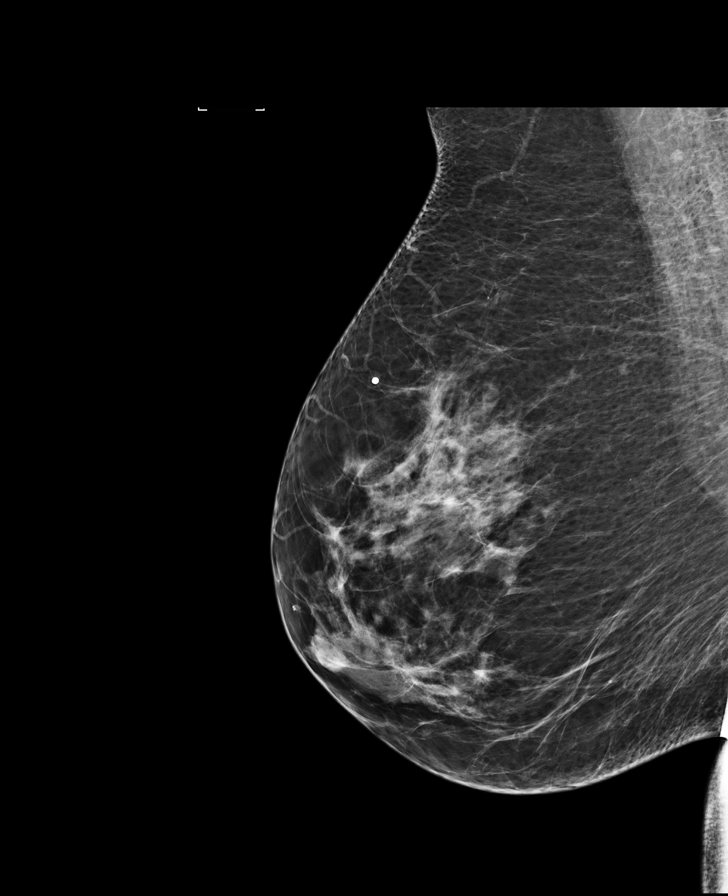

[L MLO synth-2D]
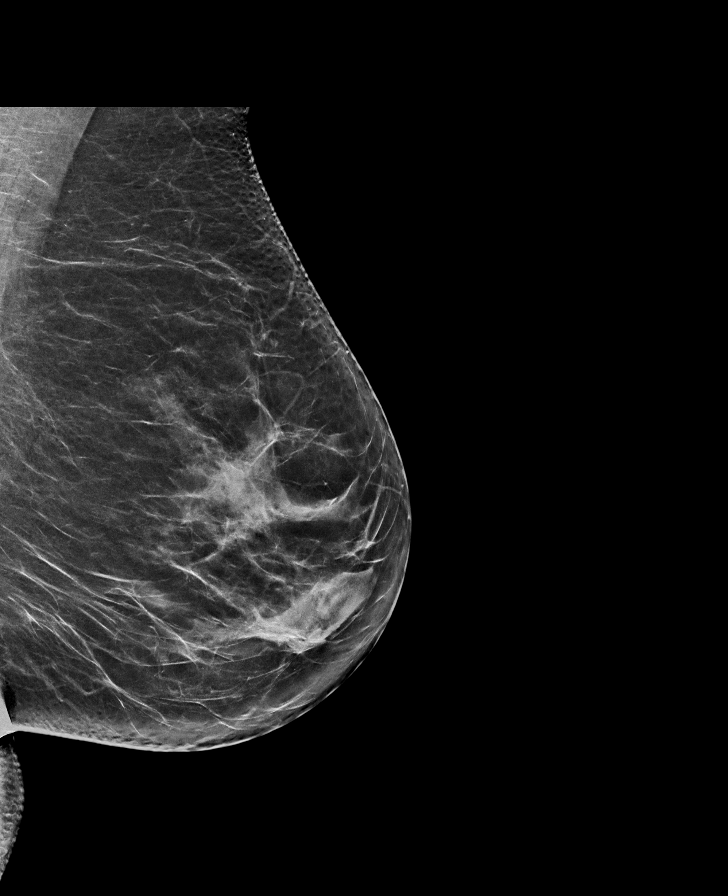

[R CC]
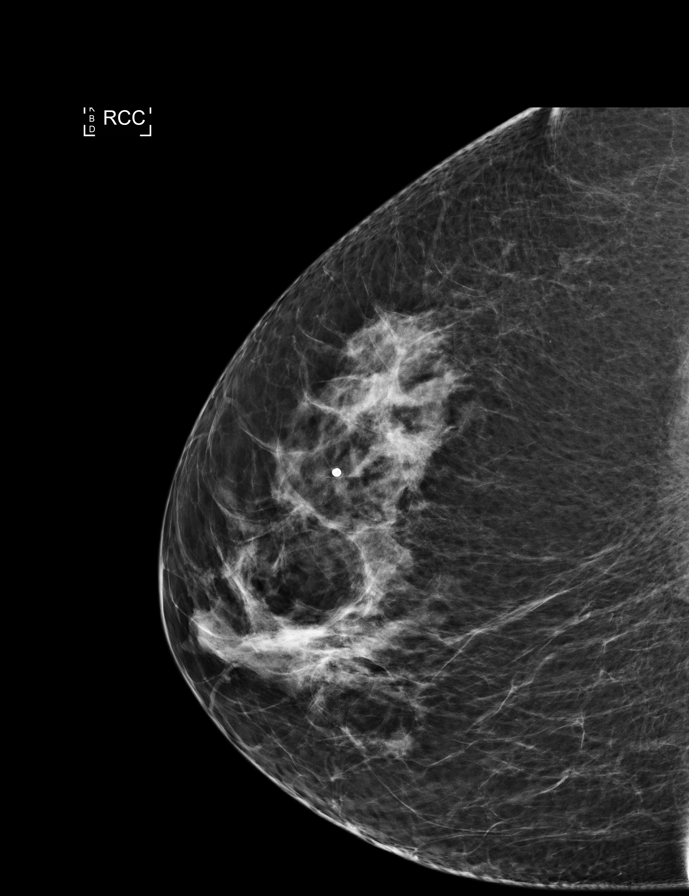

[R MLO synth-2D]
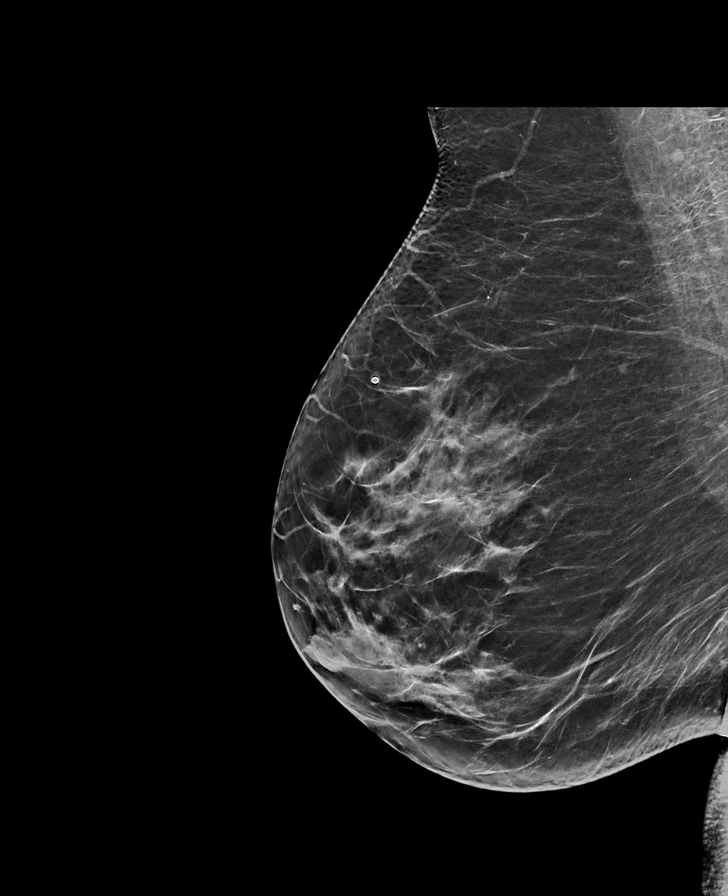

[L CC synth-2D]
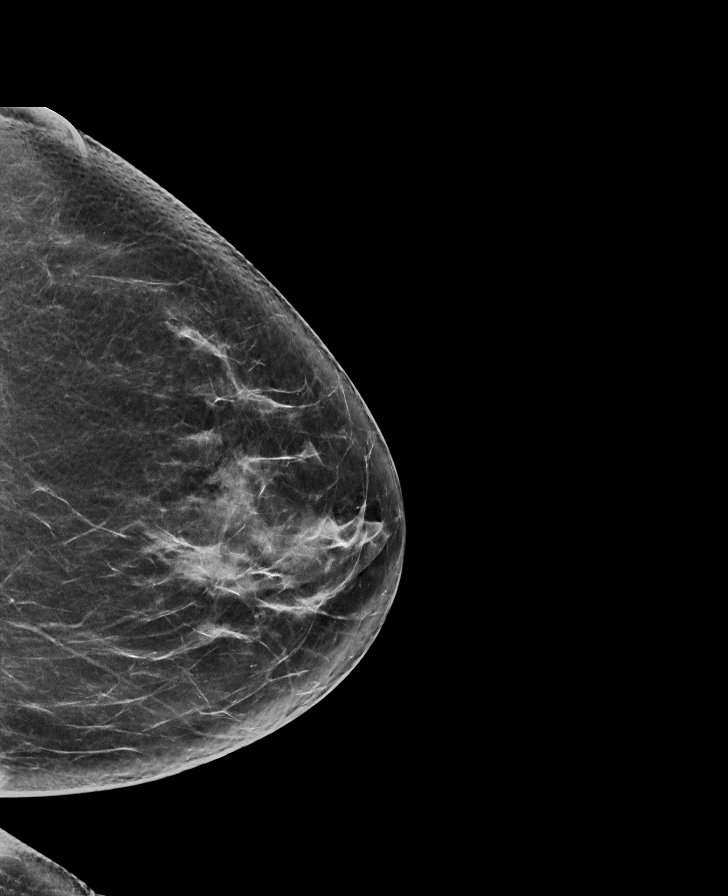

[L MLO (1 of 2)]
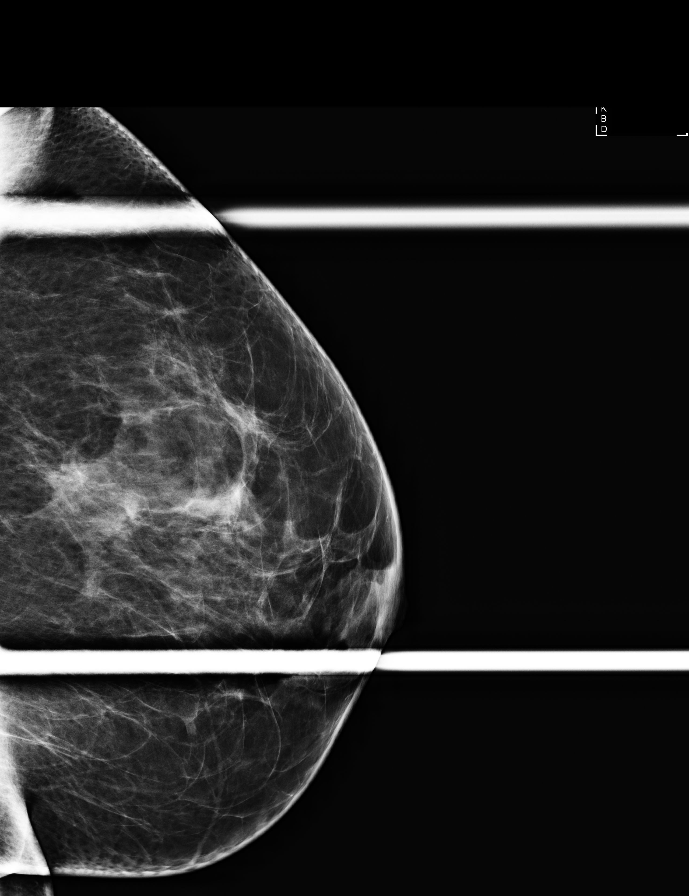

[L CC]
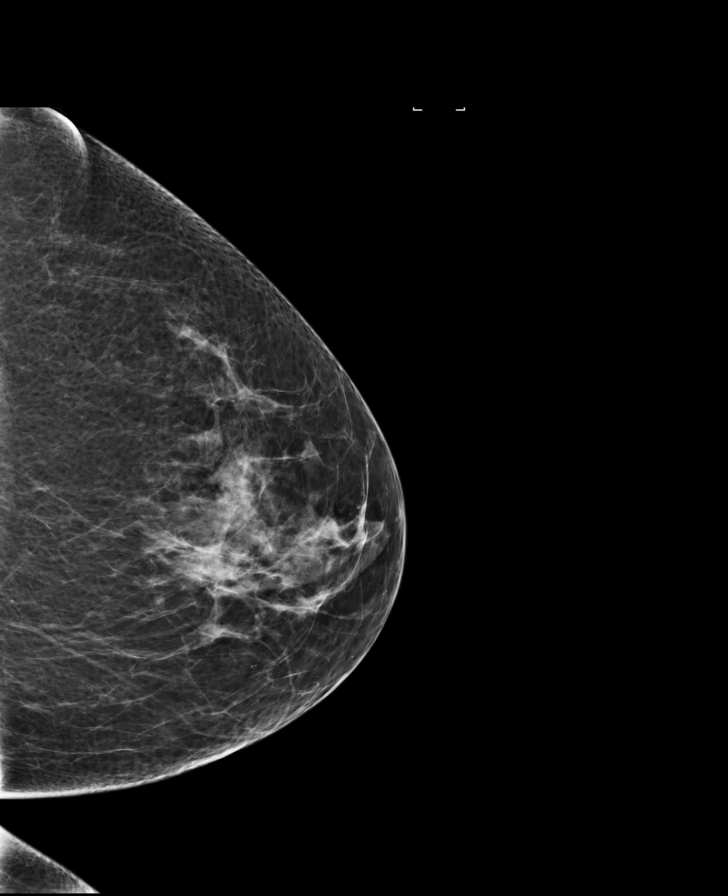

[L MLO (2 of 2)]
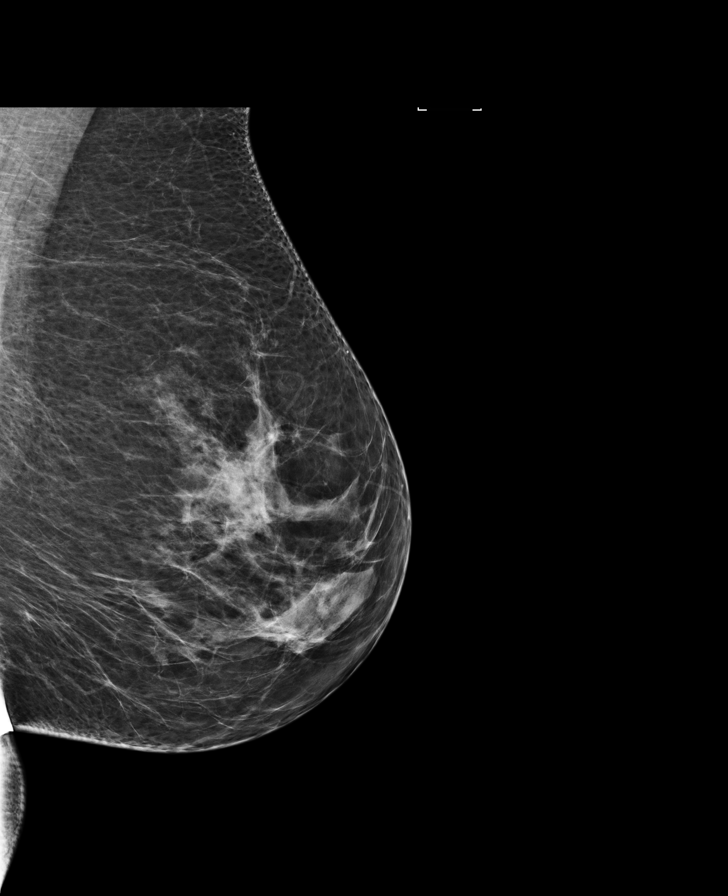

[8 of 35 positions shown; findings below may reference images not displayed]

ACR Breast Density Category c: The breast tissue is heterogeneously
dense, which may obscure small masses.
FINDINGS: No suspicious mass, calcifications, or other abnormality is
identified within either breast.

Mammographic images were processed with CAD.

On physical exam, dense, mobile breast tissue is felt within the
superior left breast from 11-12 o'clock without discrete mass.

Targeted ultrasound of the superior left breast was performed
demonstrating no suspicious cystic or solid sonographic finding in
the area of concern. Normal appearing dense breast tissue was seen.
IMPRESSION: No mammographic or sonographic evidence of malignancy.

RECOMMENDATION:
1. Screening mammogram in one year.(Code:R6-S-6CV)
2. The patient was instructed to follow-up with her referring
clinician regarding the area of concern in her left breast.

I have discussed the findings and recommendations with the patient.
Results were also provided in writing at the conclusion of the
visit. If applicable, a reminder letter will be sent to the patient
regarding the next appointment.

BI-RADS CATEGORY  1: Negative.

## 2018-07-11 DIAGNOSIS — H5005 Alternating esotropia: Secondary | ICD-10-CM | POA: Insufficient documentation

## 2018-07-11 DIAGNOSIS — H5022 Vertical strabismus, left eye: Secondary | ICD-10-CM | POA: Insufficient documentation

## 2018-07-23 ENCOUNTER — Ambulatory Visit
Admission: RE | Admit: 2018-07-23 | Discharge: 2018-07-23 | Disposition: A | Discharge status: Home / Self Care | Payer: Medicare Other | Source: Ambulatory Visit | Attending: Pediatrics | Admitting: Pediatrics

## 2018-07-23 ENCOUNTER — Other Ambulatory Visit: Payer: Self-pay | Admitting: Pediatrics

## 2018-07-23 ENCOUNTER — Encounter (INDEPENDENT_AMBULATORY_CARE_PROVIDER_SITE_OTHER): Payer: Self-pay

## 2018-07-23 DIAGNOSIS — Z1239 Encounter for other screening for malignant neoplasm of breast: Secondary | ICD-10-CM

## 2018-07-23 DIAGNOSIS — Z1231 Encounter for screening mammogram for malignant neoplasm of breast: Secondary | ICD-10-CM

## 2019-03-20 ENCOUNTER — Other Ambulatory Visit
Admission: RE | Admit: 2019-03-20 | Discharge: 2019-03-20 | Disposition: A | Discharge status: Home / Self Care | Payer: Medicare Other | Source: Ambulatory Visit | Attending: Pediatrics | Admitting: Pediatrics

## 2019-03-20 DIAGNOSIS — Z20828 Contact with and (suspected) exposure to other viral communicable diseases: Secondary | ICD-10-CM | POA: Insufficient documentation

## 2019-03-20 DIAGNOSIS — R1013 Epigastric pain: Secondary | ICD-10-CM | POA: Insufficient documentation

## 2019-03-20 DIAGNOSIS — Z01812 Encounter for preprocedural laboratory examination: Secondary | ICD-10-CM | POA: Diagnosis present

## 2019-03-21 LAB — SARS CORONAVIRUS 2 (TAT 6-24 HRS): SARS Coronavirus 2: NEGATIVE

## 2019-03-25 ENCOUNTER — Ambulatory Visit: Payer: Medicare Other | Admitting: Anesthesiology

## 2019-03-25 ENCOUNTER — Ambulatory Visit
Admission: RE | Admit: 2019-03-25 | Discharge: 2019-03-25 | Disposition: A | Discharge status: Home / Self Care | Payer: Medicare Other | Attending: General Surgery | Admitting: General Surgery

## 2019-03-25 ENCOUNTER — Other Ambulatory Visit: Payer: Self-pay

## 2019-03-25 ENCOUNTER — Encounter: Payer: Self-pay | Admitting: *Deleted

## 2019-03-25 ENCOUNTER — Encounter
Admission: RE | Disposition: A | Discharge status: Home / Self Care | Payer: Self-pay | Source: Home / Self Care | Attending: *Deleted

## 2019-03-25 DIAGNOSIS — H35033 Hypertensive retinopathy, bilateral: Secondary | ICD-10-CM | POA: Insufficient documentation

## 2019-03-25 DIAGNOSIS — K219 Gastro-esophageal reflux disease without esophagitis: Secondary | ICD-10-CM | POA: Insufficient documentation

## 2019-03-25 DIAGNOSIS — J8410 Pulmonary fibrosis, unspecified: Secondary | ICD-10-CM | POA: Insufficient documentation

## 2019-03-25 DIAGNOSIS — G4700 Insomnia, unspecified: Secondary | ICD-10-CM | POA: Diagnosis not present

## 2019-03-25 DIAGNOSIS — Z79899 Other long term (current) drug therapy: Secondary | ICD-10-CM | POA: Diagnosis not present

## 2019-03-25 DIAGNOSIS — G2581 Restless legs syndrome: Secondary | ICD-10-CM | POA: Insufficient documentation

## 2019-03-25 DIAGNOSIS — R1013 Epigastric pain: Secondary | ICD-10-CM | POA: Diagnosis not present

## 2019-03-25 HISTORY — PX: ESOPHAGOGASTRODUODENOSCOPY (EGD) WITH PROPOFOL: SHX5813

## 2019-03-25 SURGERY — ESOPHAGOGASTRODUODENOSCOPY (EGD) WITH PROPOFOL
Anesthesia: General

## 2019-03-25 MED ORDER — LIDOCAINE HCL (CARDIAC) PF 100 MG/5ML IV SOSY
PREFILLED_SYRINGE | INTRAVENOUS | Status: DC | PRN
  Administered 2019-03-25: 50 mg via INTRAVENOUS

## 2019-03-25 MED ORDER — PROPOFOL 10 MG/ML IV BOLUS
INTRAVENOUS | Status: DC | PRN
  Administered 2019-03-25: 10 mg via INTRAVENOUS
  Administered 2019-03-25: 50 mg via INTRAVENOUS

## 2019-03-25 MED ORDER — PROPOFOL 500 MG/50ML IV EMUL
INTRAVENOUS | Status: DC | PRN
  Administered 2019-03-25: 150 ug/kg/min via INTRAVENOUS

## 2019-03-25 MED ORDER — PROPOFOL 500 MG/50ML IV EMUL
INTRAVENOUS | Status: AC
  Filled 2019-03-25: qty 50

## 2019-03-25 MED ORDER — SODIUM CHLORIDE 0.9 % IV SOLN
INTRAVENOUS | Status: DC
  Administered 2019-03-25: 09:00:00 via INTRAVENOUS

## 2019-03-25 NOTE — Transfer of Care (Signed)
Immediate Anesthesia Transfer of Care Note  Patient: Joanna Chapman  Procedure(s) Performed: ESOPHAGOGASTRODUODENOSCOPY (EGD) WITH PROPOFOL (N/A )  Patient Location: PACU  Anesthesia Type:General  Level of Consciousness: sedated  Airway & Oxygen Therapy: Patient Spontanous Breathing  Post-op Assessment: Report given to RN and Post -op Vital signs reviewed and stable  Post vital signs: Reviewed and stable  Last Vitals:  Vitals Value Taken Time  BP 102/53 03/25/19 0941  Temp 36.2 C 03/25/19 0941  Pulse 64 03/25/19 0942  Resp 12 03/25/19 0942  SpO2 94 % 03/25/19 0942  Vitals shown include unvalidated device data.  Last Pain:  Vitals:   03/25/19 0941  TempSrc: Tympanic  PainSc: Asleep         Complications: No apparent anesthesia complications

## 2019-03-25 NOTE — H&P (Signed)
Joanna Chapman CX:[id number] 14-Jan-1946     HPI:  73 y/o woman with a six month history of progressive bitter reflux, especially at night.  Eats her evening meal about six, no snacks.  Bedtime is 10 PM. She notes thru the day a "rumbling" sensation in her upper abdomen not associated with pain, nausea, vomiting or diarrhea.  At night, this "rumbling" is interfering with sleep.  She denies any sense of an acid sensation during the day or night.  She reports she will occasionally bring up a small amount of bitter liquid, not associated with meals or any sense of nausea or vomiting.  No dysphagia. No weight loss. She has been modifying her diet to control her IBS-D symptoms with good results.   Medications Prior to Admission  Medication Sig Dispense Refill Last Dose  . diclofenac sodium (VOLTAREN) 1 % GEL Apply topically 4 (four) times daily.   Past Month at Unknown time  . meloxicam (MOBIC) 7.5 MG tablet Take 7.5 mg by mouth daily.   Past Month at Unknown time  . pantoprazole (PROTONIX) 40 MG tablet Take 40 mg by mouth daily.   03/24/2019 at Unknown time  . Cholecalciferol (VITAMIN D) 2000 units CAPS Take 2 capsules by mouth daily.   03/18/2019  . cycloSPORINE (RESTASIS) 0.05 % ophthalmic emulsion Place 1 drop into both eyes 2 (two) times daily.   Not Taking at Unknown time  . ferrous sulfate 325 (65 FE) MG tablet Take 325 mg by mouth daily with breakfast.   [id number]  . folic acid (FOLVITE) 1 MG tablet Take 1 mg by mouth daily.   03/18/2019  . gabapentin (NEURONTIN) 100 MG capsule Take 1 capsule (100 mg total) by mouth at bedtime. (Patient not taking: Reported on 11/21/2016) 180 capsule 1 Not Taking at Unknown time  . magnesium 30 MG tablet Take 200 mg by mouth 2 (two) times daily.   03/18/2019  . Melatonin 1 MG TABS Take 1 mg by mouth at bedtime as needed.   03/18/2019  . methotrexate (RHEUMATREX) 2.5 MG tablet Take 8 tablets by mouth once a week.   03/18/2019  . Multiple Vitamins-Minerals (PRESERVISION  AREDS 2) CAPS Take by mouth.   03/18/2019  . pilocarpine (SALAGEN) 5 MG tablet Take 5 mg by mouth 3 (three) times daily.    Not Taking at Unknown time  . predniSONE (DELTASONE) 10 MG tablet Take 10 mg by mouth as needed.   Not Taking at Unknown time  . tacrolimus (PROTOPIC) 0.1 % ointment Apply topically 2 (two) times daily.   Not Taking at Unknown time  . traZODone (DESYREL) 50 MG tablet Take 50 mg by mouth at bedtime.   Not Taking at Unknown time  . zolpidem (AMBIEN) 5 MG tablet Take 5 mg by mouth at bedtime as needed for sleep.   Not Taking at Unknown time   Allergies  Allergen Reactions  . Rutherford Nail [Apremilast] Other (See Comments)    Vivid Nightmares  . Penicillins Hives   Past Medical History:  Diagnosis Date  . Cataract cortical, senile   . Dry eye syndrome, bilateral   . History of colon polyps   . Insomnia   . Macular degeneration, wet ([place])   . Psoriatic arthritis ([place])   . Pulmonary fibrosis ([place])   . Restless leg syndrome   . Retinopathy, hypertensive, bilateral   . Sjogren's disease ([place])    Past Surgical History:  Procedure Laterality Date  . BUNIONECTOMY Right 1990's  . COLONOSCOPY    .  COLONOSCOPY WITH PROPOFOL N/A 11/22/2016   Procedure: COLONOSCOPY WITH PROPOFOL;  Surgeon: Lollie Sails, MD;  Location: [facility] ENDOSCOPY;  Service: Endoscopy;  Laterality: N/A;  . EYE SURGERY    . GRAFT PERIODONTAL MUCOSA  09/2015  . LEFT HEART CATH  10/08/2013  . TUBAL LIGATION  1970's   Social History   Socioeconomic History  . Marital status: Married    Spouse name: Not on file  . Number of children: Not on file  . Years of education: Not on file  . Highest education level: Not on file  Occupational History  . Not on file  Social Needs  . Financial resource strain: Not on file  . Food insecurity    Worry: Not on file    Inability: Not on file  . Transportation needs    Medical: Not on file    Non-medical: Not on file  Tobacco Use  . Smoking status: Never Smoker   . Smokeless tobacco: Never Used  Substance and Sexual Activity  . Alcohol use: Yes    Alcohol/week: 0.0 standard drinks    Comment: OCCASSIONALLY  . Drug use: No  . Sexual activity: Not on file  Lifestyle  . Physical activity    Days per week: Not on file    Minutes per session: Not on file  . Stress: Not on file  Relationships  . Social Herbalist on phone: Not on file    Gets together: Not on file    Attends religious service: Not on file    Active member of club or organization: Not on file    Attends meetings of clubs or organizations: Not on file    Relationship status: Not on file  . Intimate partner violence    Fear of current or ex partner: Not on file    Emotionally abused: Not on file    Physically abused: Not on file    Forced sexual activity: Not on file  Other Topics Concern  . Not on file  Social History Narrative  . Not on file   Social History   Social History Narrative  . Not on file     ROS: Negative.     PE: HEENT: Negative. Lungs: Clear. Cardio: RR. ABD: Soft, non-tender. No mass effect.   Assessment/Plan:  Proceed with planned  Upper endoscopy. Joanna Chapman 03/25/2019

## 2019-03-25 NOTE — Anesthesia Postprocedure Evaluation (Signed)
Anesthesia Post Note  Patient: Joanna Chapman  Procedure(s) Performed: ESOPHAGOGASTRODUODENOSCOPY (EGD) WITH PROPOFOL (N/A )  Patient location during evaluation: Endoscopy Anesthesia Type: General Level of consciousness: awake and alert Pain management: pain level controlled Vital Signs Assessment: post-procedure vital signs reviewed and stable Respiratory status: spontaneous breathing, nonlabored ventilation, respiratory function stable and patient connected to nasal cannula oxygen Cardiovascular status: blood pressure returned to baseline and stable Postop Assessment: no apparent nausea or vomiting Anesthetic complications: no     Last Vitals:  Vitals:   03/25/19 1001 03/25/19 1011  BP: 125/67 130/75  Pulse: (!) 59 (!) 59  Resp: 13 12  Temp:    SpO2: 95% 98%    Last Pain:  Vitals:   03/25/19 1011  TempSrc:   PainSc: 0-No pain                 Martha Clan

## 2019-03-25 NOTE — Op Note (Signed)
[facility] Gastroenterology Patient Name: Joanna Chapman Procedure Date: 03/25/2019 9:13 AM MRN: [id number] Account #: [id number] Date of Birth: [birth year] Admit Type: Outpatient Age: 73 Room: [facility] ENDO ROOM 1 Gender: Female Note Status: Finalized Procedure:            Upper GI endoscopy Indications:          Epigastric abdominal pain, Esophageal reflux symptoms                        that persist despite appropriate therapy Providers:            Robert Bellow, MD Referring MD:         Ane Payment, MD (Referring MD) Medicines:            Monitored Anesthesia Care Complications:        No immediate complications. Procedure:            Pre-Anesthesia Assessment:                       - Prior to the procedure, a History and Physical was                        performed, and patient medications, allergies and                        sensitivities were reviewed. The patient's tolerance of                        previous anesthesia was reviewed.                       - The risks and benefits of the procedure and the                        sedation options and risks were discussed with the                        patient. All questions were answered and informed                        consent was obtained.                       After obtaining informed consent, the endoscope was                        passed under direct vision. Throughout the procedure,                        the patient's blood pressure, pulse, and oxygen                        saturations were monitored continuously. The Endoscope                        was introduced through the mouth, and advanced to the                        third part of duodenum. The upper GI endoscopy was  accomplished without difficulty. The patient tolerated                        the procedure well. Findings:      The esophagus was normal. Biopsy x 3 of the distal esophagus was   completed to evaluate for eosinophilic esophagitis.      The stomach was normal.      The examined duodenum was normal. Impression:           - Normal esophagus.                       - Normal stomach.                       - Normal examined duodenum.                       - No specimens collected. Recommendation:       - Telephone endoscopist for pathology results                        [Telephone day]. Robert Bellow, MD 03/25/2019 9:39:41 AM This report has been signed electronically. Number of Addenda: 0 Note Initiated On: 03/25/2019 9:13 AM Estimated Blood Loss: Estimated blood loss: none.      [facility]

## 2019-03-25 NOTE — Anesthesia Preprocedure Evaluation (Signed)
Anesthesia Evaluation  Patient identified by MRN, date of birth, ID band Patient awake    Reviewed: Allergy & Precautions, NPO status , Patient's Chart, lab work & pertinent test results  History of Anesthesia Complications Negative for: history of anesthetic complications  Airway Mallampati: III       Dental  (+) Teeth Intact, Caps   Pulmonary neg pulmonary ROS,    breath sounds clear to auscultation       Cardiovascular negative cardio ROS   Rhythm:Regular     Neuro/Psych negative neurological ROS     GI/Hepatic Neg liver ROS, GERD  ,  Endo/Other  negative endocrine ROS  Renal/GU negative Renal ROS     Musculoskeletal negative musculoskeletal ROS (+)   Abdominal Normal abdominal exam  (+)   Peds negative pediatric ROS (+)  Hematology negative hematology ROS (+)   Anesthesia Other Findings Past Medical History: No date: Cataract cortical, senile No date: Dry eye syndrome, bilateral No date: History of colon polyps No date: Insomnia No date: Macular degeneration, wet (HCC) No date: Psoriatic arthritis ([place]) No date: Pulmonary fibrosis (HCC) No date: Restless leg syndrome No date: Retinopathy, hypertensive, bilateral No date: Sjogren's disease ([place])   Reproductive/Obstetrics                             Anesthesia Physical  Anesthesia Plan  ASA: II  Anesthesia Plan: General   Post-op Pain Management:    Induction: Intravenous  PONV Risk Score and Plan: 1 and Treatment may vary due to age, Propofol infusion and TIVA  Airway Management Planned: Natural Airway and Nasal Cannula  Additional Equipment:   Intra-op Plan:   Post-operative Plan:   Informed Consent: I have reviewed the patients History and Physical, chart, labs and discussed the procedure including the risks, benefits and alternatives for the proposed anesthesia with the patient or authorized representative  who has indicated his/her understanding and acceptance.       Plan Discussed with: CRNA and Surgeon  Anesthesia Plan Comments:         Anesthesia Quick Evaluation

## 2019-03-25 NOTE — Anesthesia Post-op Follow-up Note (Signed)
Anesthesia QCDR form completed.        

## 2019-03-25 NOTE — Anesthesia Procedure Notes (Signed)
Date/Time: 03/25/2019 9:24 AM Performed by: Johnna Acosta, CRNA Pre-anesthesia Checklist: Patient identified, Emergency Drugs available, Suction available, Patient being monitored and Timeout performed Patient Re-evaluated:Patient Re-evaluated prior to induction Oxygen Delivery Method: Nasal cannula Preoxygenation: Pre-oxygenation with 100% oxygen

## 2019-03-26 ENCOUNTER — Encounter: Payer: Self-pay | Admitting: General Surgery

## 2019-03-26 LAB — SURGICAL PATHOLOGY

## 2019-04-02 ENCOUNTER — Other Ambulatory Visit: Payer: Self-pay | Admitting: Gastroenterology

## 2019-04-02 ENCOUNTER — Other Ambulatory Visit (HOSPITAL_COMMUNITY): Payer: Self-pay | Admitting: Gastroenterology

## 2019-04-02 DIAGNOSIS — R1084 Generalized abdominal pain: Secondary | ICD-10-CM

## 2019-04-07 ENCOUNTER — Other Ambulatory Visit
Admission: RE | Admit: 2019-04-07 | Discharge: 2019-04-07 | Disposition: A | Discharge status: Home / Self Care | Payer: Medicare Other | Source: Home / Self Care | Attending: Gastroenterology | Admitting: Gastroenterology

## 2019-04-07 ENCOUNTER — Ambulatory Visit
Admission: RE | Admit: 2019-04-07 | Discharge: 2019-04-07 | Disposition: A | Discharge status: Home / Self Care | Payer: Medicare Other | Source: Ambulatory Visit | Attending: Gastroenterology | Admitting: Gastroenterology

## 2019-04-07 ENCOUNTER — Other Ambulatory Visit: Payer: Self-pay

## 2019-04-07 DIAGNOSIS — R1084 Generalized abdominal pain: Secondary | ICD-10-CM | POA: Diagnosis present

## 2019-04-07 HISTORY — DX: Malignant (primary) neoplasm, unspecified: C80.1

## 2019-04-07 LAB — CREATININE, SERUM
Creatinine, Ser: 0.63 mg/dL (ref 0.44–1.00)
GFR calc Af Amer: >60 mL/min (ref >60)
GFR calc non Af Amer: >60 mL/min (ref >60)

## 2019-04-07 MED ORDER — IOHEXOL 300 MG/ML  SOLN
100.0000 mL | Freq: Once | INTRAMUSCULAR | Status: AC | PRN
  Administered 2019-04-07: 100 mL via INTRAVENOUS

## 2019-07-30 DIAGNOSIS — G4700 Insomnia, unspecified: Secondary | ICD-10-CM | POA: Insufficient documentation

## 2020-02-08 ENCOUNTER — Other Ambulatory Visit: Payer: Self-pay

## 2020-02-08 ENCOUNTER — Ambulatory Visit (INDEPENDENT_AMBULATORY_CARE_PROVIDER_SITE_OTHER): Payer: Medicare Other | Admitting: Gastroenterology

## 2020-02-08 ENCOUNTER — Encounter: Payer: Self-pay | Admitting: Gastroenterology

## 2020-02-08 DIAGNOSIS — R140 Abdominal distension (gaseous): Secondary | ICD-10-CM

## 2020-02-08 DIAGNOSIS — R143 Flatulence: Secondary | ICD-10-CM | POA: Diagnosis not present

## 2020-02-08 NOTE — Progress Notes (Signed)
Gastroenterology Consultation  Referring Provider:     Barbaraann Boys, MD Primary Care Physician:  Barbaraann Boys, MD Primary Gastroenterologist:  Dr. Allen Norris     Reason for Consultation:     GERD and bloating        HPI:   Joanna Chapman is a 74 y.o. y/o female referred for consultation & management of GERD and bloating by Dr. Barbaraann Boys, MD.  This patient comes today for history of GERD.  The patient was seen by Dr. Bary Castilla less than 1 year ago for the same issues and had an upper endoscopy at that time that was normal and multiple biopsies of the esophagus were taken without any significant findings.  The patient had been seeing [facility] up until December of last year.  Her last colonoscopy was in 2018 by Dr. Gustavo Lah.  The patient had 6 polyps at that time none of which were adenomatous. The patient reports that she never really had pain despite multiple people characterizing her symptoms as pain.  She states that people are listening to her and she is very frustrated which is quite understandable. The patient reports that she has episodes of diarrhea when she wakes up in the morning and has bloating throughout the day.  She also reports that she has a lot of excessive belching.  She was on a PPI for reflux symptoms but was scared of getting C. Difficile because she know somebody who got C. Difficile and was very sick.  There is no report of any unexplained weight loss fevers chills nausea or vomiting. The patient reports that her symptoms are improved when she takes oregano oil and also when she takes Pepto-Bismol.  She was concerned that these may not be healthy for her.  Past Medical History:  Diagnosis Date  . Cancer ([place])    skin cancer  . Cataract cortical, senile   . Dry eye syndrome, bilateral   . History of colon polyps   . Insomnia   . Macular degeneration, wet ([place])   . Psoriatic arthritis ([place])   . Pulmonary fibrosis ([place])   . Restless leg syndrome   .  Retinopathy, hypertensive, bilateral   . Sjogren's disease ([place])    rheumatoid arthritis also    Past Surgical History:  Procedure Laterality Date  . BUNIONECTOMY Right 1990's  . COLONOSCOPY    . COLONOSCOPY WITH PROPOFOL N/A 11/22/2016   Procedure: COLONOSCOPY WITH PROPOFOL;  Surgeon: Lollie Sails, MD;  [facility] ENDOSCOPY;  Service: Endoscopy;  Laterality: N/A;  . ESOPHAGOGASTRODUODENOSCOPY (EGD) WITH PROPOFOL N/A 03/25/2019   Procedure: ESOPHAGOGASTRODUODENOSCOPY (EGD) WITH PROPOFOL;  Surgeon: Robert Bellow, MD;  Location: [facility] ENDOSCOPY;  Service: Endoscopy;  Laterality: N/A;  . EYE SURGERY    . GRAFT PERIODONTAL MUCOSA  09/2015  . LEFT HEART CATH  10/08/2013  . TUBAL LIGATION  1970's    Prior to Admission medications   Medication Sig Start Date End Date Taking? Authorizing Provider  Carboxymeth-Glycerin-Polysorb 0.5-1-0.5 % SOLN Apply to eye.    [provider]  Cholecalciferol (VITAMIN D) 2000 units CAPS Take 2 capsules by mouth daily.    [provider]  cycloSPORINE (RESTASIS) 0.05 % ophthalmic emulsion Place 1 drop into both eyes 2 (two) times daily.    [provider]  diclofenac sodium (VOLTAREN) 1 % GEL Apply topically 4 (four) times daily.    [provider]  ferrous sulfate 325 (65 FE) MG tablet Take 325 mg by mouth daily with  breakfast.    [provider]  folic acid (FOLVITE) 1 MG tablet Take 1 mg by mouth daily.    [provider]  gabapentin (NEURONTIN) 100 MG capsule Take 1 capsule (100 mg total) by mouth at bedtime. Patient not taking: Reported on 11/21/2016 09/06/15   Glean Hess, MD  Magnesium 200 MG TABS Take by mouth.    [provider]  Melatonin 1 MG TABS Take 1 mg by mouth at bedtime as needed.    [provider]  meloxicam (MOBIC) 7.5 MG tablet Take 7.5 mg by mouth daily.    [provider]  methotrexate (RHEUMATREX) 2.5 MG tablet Take 8 tablets by mouth once a  week. 06/19/15   [provider]  Multiple Vitamins-Minerals (PRESERVISION AREDS 2) CAPS Take by mouth.    [provider]  Omega-3 Fatty Acids (FISH OIL) 1000 MG CAPS Take by mouth.    [provider]  pantoprazole (PROTONIX) 40 MG tablet Take 40 mg by mouth daily.    [provider]  pilocarpine (SALAGEN) 5 MG tablet Take 5 mg by mouth 3 (three) times daily.     [provider]  predniSONE (DELTASONE) 10 MG tablet Take 10 mg by mouth as needed.    [provider]  tacrolimus (PROTOPIC) 0.1 % ointment Apply topically 2 (two) times daily.    [provider]  traZODone (DESYREL) 50 MG tablet Take 50 mg by mouth at bedtime.    [provider]  zolpidem (AMBIEN) 5 MG tablet Take 5 mg by mouth at bedtime as needed for sleep.    [provider]    Family History  Problem Relation Age of Onset  . Congestive Heart Failure Mother   . Breast cancer Neg Hx      Social History   Tobacco Use  . Smoking status: Never Smoker  . Smokeless tobacco: Never Used  Vaping Use  . Vaping Use: Never used  Substance Use Topics  . Alcohol use: Yes    Alcohol/week: 0.0 standard drinks    Comment: OCCASSIONALLY  . Drug use: No    Allergies as of 02/08/2020 - Review Complete 04/07/2019  Allergen Reaction Noted  . Otezla [apremilast] Other (See Comments) 09/06/2015  . Penicillins Hives 09/06/2015    Review of Systems:    All systems reviewed and negative except where noted in HPI.   Physical Exam:  There were no vitals taken for this visit. No LMP recorded. Patient is postmenopausal. General:   Alert,  Well-developed, well-nourished, pleasant and cooperative in NAD Head:  Normocephalic and atraumatic. Eyes:  Sclera clear, no icterus.   Conjunctiva pink. Ears:  Normal auditory acuity. Neck:  Supple; no masses or thyromegaly. Lungs:  Respirations even and unlabored.  Clear throughout to auscultation.   No wheezes,  crackles, or rhonchi. No acute distress. Heart:  Regular rate and rhythm; no murmurs, clicks, rubs, or gallops. Abdomen:  Normal bowel sounds.  No bruits.  Soft, non-tender and non-distended without masses, hepatosplenomegaly or hernias noted.  No guarding or rebound tenderness.  Negative Carnett sign.   Rectal:  Deferred.  Pulses:  Normal pulses noted. Extremities:  No clubbing or edema.  No cyanosis. Neurologic:  Alert and oriented x3;  grossly normal neurologically. Skin:  Intact without significant lesions or rashes.  No jaundice. Lymph Nodes:  No significant cervical adenopathy. Psych:  Alert and cooperative. Normal mood and affect.  Imaging Studies: No results found.  Assessment and Plan:  Monee Dembeck is a 74 y.o. y/o female who comes in with bloating with burping and flatulence. The patient also has diarrhea in the morning.  The patient has been told to try and avoid foods that may be causing her diarrhea during dinnertime which is manifesting by having diarrhea in the morning.  She has also been told to avoid things that increase gas by drinking carbonated drinks, drinking with a straw, chewing gum or sipping hot drinks.  She also has been told that eating fast can also cause her to swallow air.  She has been explained that the only way air comes up as if it went down by swallowing it.  She has also been told that her flatulence is likely a result of both swallowing air and something she may be eating.  The patient will try to keep track of what's causing her symptoms and then try to avoid those things.  The patient has been explained the plan and agrees with it.    Lucilla Lame, MD. Marval Regal    Note: This dictation was prepared with Dragon dictation along with smaller phrase technology. Any transcriptional errors that result from this process are unintentional.

## 2020-07-05 ENCOUNTER — Other Ambulatory Visit: Payer: Self-pay | Admitting: Pediatrics

## 2020-07-05 DIAGNOSIS — Z1231 Encounter for screening mammogram for malignant neoplasm of breast: Secondary | ICD-10-CM

## 2020-07-20 ENCOUNTER — Other Ambulatory Visit: Payer: Self-pay

## 2020-07-20 ENCOUNTER — Ambulatory Visit
Admission: RE | Admit: 2020-07-20 | Discharge: 2020-07-20 | Disposition: A | Discharge status: Home / Self Care | Payer: Medicare Other | Source: Ambulatory Visit | Attending: Pediatrics | Admitting: Pediatrics

## 2020-07-20 DIAGNOSIS — Z1231 Encounter for screening mammogram for malignant neoplasm of breast: Secondary | ICD-10-CM | POA: Diagnosis present

## 2022-09-06 ENCOUNTER — Other Ambulatory Visit: Payer: Self-pay | Admitting: Pediatrics

## 2022-09-06 DIAGNOSIS — Z1231 Encounter for screening mammogram for malignant neoplasm of breast: Secondary | ICD-10-CM

## 2022-09-11 ENCOUNTER — Ambulatory Visit
Admission: RE | Admit: 2022-09-11 | Discharge: 2022-09-11 | Disposition: A | Discharge status: Home / Self Care | Payer: Medicare Other | Source: Ambulatory Visit | Attending: Pediatrics | Admitting: Pediatrics

## 2022-09-11 DIAGNOSIS — Z1231 Encounter for screening mammogram for malignant neoplasm of breast: Secondary | ICD-10-CM | POA: Diagnosis not present

## 2023-10-14 ENCOUNTER — Other Ambulatory Visit: Payer: Self-pay | Admitting: Pediatrics

## 2023-10-14 DIAGNOSIS — Z1231 Encounter for screening mammogram for malignant neoplasm of breast: Secondary | ICD-10-CM

## 2023-10-29 ENCOUNTER — Ambulatory Visit
Admission: RE | Admit: 2023-10-29 | Discharge: 2023-10-29 | Disposition: A | Discharge status: Home / Self Care | Source: Ambulatory Visit | Attending: Pediatrics | Admitting: Pediatrics

## 2023-10-29 DIAGNOSIS — Z1231 Encounter for screening mammogram for malignant neoplasm of breast: Secondary | ICD-10-CM | POA: Insufficient documentation

## 2024-02-12 ENCOUNTER — Ambulatory Visit
